# Patient Record
Sex: Female | Born: 1980
Health system: Southern US, Community
[De-identification: ages and names within clinical notes are randomized; demographics above are authoritative.]

## PROBLEM LIST (undated history)

## (undated) DIAGNOSIS — IMO0001 Reserved for inherently not codable concepts without codable children: Secondary | ICD-10-CM

## (undated) DIAGNOSIS — G43909 Migraine, unspecified, not intractable, without status migrainosus: Secondary | ICD-10-CM

## (undated) DIAGNOSIS — M797 Fibromyalgia: Secondary | ICD-10-CM

## (undated) HISTORY — PX: TONSILLECTOMY: SUR1361

## (undated) HISTORY — PX: TUBAL LIGATION: SHX77

## (undated) HISTORY — DX: Migraine, unspecified, not intractable, without status migrainosus: G43.909

## (undated) HISTORY — DX: Reserved for inherently not codable concepts without codable children: IMO0001

## (undated) HISTORY — PX: ABLATION: SHX5711

## (undated) HISTORY — PX: CHOLECYSTECTOMY: SHX55

---

## 1998-05-11 ENCOUNTER — Other Ambulatory Visit: Admission: RE | Admit: 1998-05-11 | Discharge: 1998-05-11 | Payer: Self-pay | Admitting: Obstetrics and Gynecology

## 1998-11-11 ENCOUNTER — Emergency Department (HOSPITAL_COMMUNITY): Admission: EM | Admit: 1998-11-11 | Discharge: 1998-11-11 | Payer: Self-pay | Admitting: Emergency Medicine

## 1999-06-26 ENCOUNTER — Inpatient Hospital Stay (HOSPITAL_COMMUNITY): Admission: AD | Admit: 1999-06-26 | Discharge: 1999-06-28 | Payer: Self-pay | Admitting: *Deleted

## 1999-06-26 ENCOUNTER — Encounter (INDEPENDENT_AMBULATORY_CARE_PROVIDER_SITE_OTHER): Payer: Self-pay

## 1999-08-06 ENCOUNTER — Other Ambulatory Visit: Admission: RE | Admit: 1999-08-06 | Discharge: 1999-08-06 | Payer: Self-pay | Admitting: *Deleted

## 2000-08-18 ENCOUNTER — Emergency Department (HOSPITAL_COMMUNITY): Admission: EM | Admit: 2000-08-18 | Discharge: 2000-08-18 | Payer: Self-pay | Admitting: Emergency Medicine

## 2000-09-08 ENCOUNTER — Other Ambulatory Visit: Admission: RE | Admit: 2000-09-08 | Discharge: 2000-09-08 | Payer: Self-pay | Admitting: *Deleted

## 2000-09-20 ENCOUNTER — Encounter: Payer: Self-pay | Admitting: Emergency Medicine

## 2000-09-20 ENCOUNTER — Emergency Department (HOSPITAL_COMMUNITY): Admission: EM | Admit: 2000-09-20 | Discharge: 2000-09-21 | Payer: Self-pay | Admitting: Emergency Medicine

## 2000-11-11 ENCOUNTER — Other Ambulatory Visit: Admission: RE | Admit: 2000-11-11 | Discharge: 2000-11-11 | Payer: Self-pay | Admitting: *Deleted

## 2000-11-11 ENCOUNTER — Encounter (INDEPENDENT_AMBULATORY_CARE_PROVIDER_SITE_OTHER): Payer: Self-pay

## 2000-11-13 ENCOUNTER — Encounter (INDEPENDENT_AMBULATORY_CARE_PROVIDER_SITE_OTHER): Payer: Self-pay | Admitting: Specialist

## 2000-11-13 ENCOUNTER — Encounter: Payer: Self-pay | Admitting: Surgery

## 2000-11-13 ENCOUNTER — Observation Stay (HOSPITAL_COMMUNITY): Admission: RE | Admit: 2000-11-13 | Discharge: 2000-11-14 | Payer: Self-pay | Admitting: Otolaryngology

## 2001-12-01 ENCOUNTER — Ambulatory Visit (HOSPITAL_COMMUNITY): Admission: RE | Admit: 2001-12-01 | Discharge: 2001-12-01 | Payer: Self-pay

## 2002-02-26 ENCOUNTER — Inpatient Hospital Stay (HOSPITAL_COMMUNITY): Admission: AD | Admit: 2002-02-26 | Discharge: 2002-02-28 | Payer: Self-pay

## 2002-09-09 ENCOUNTER — Emergency Department (HOSPITAL_COMMUNITY): Admission: EM | Admit: 2002-09-09 | Discharge: 2002-09-10 | Payer: Self-pay | Admitting: Emergency Medicine

## 2002-10-27 ENCOUNTER — Other Ambulatory Visit: Admission: RE | Admit: 2002-10-27 | Discharge: 2002-10-27 | Payer: Self-pay | Admitting: Obstetrics and Gynecology

## 2002-11-05 ENCOUNTER — Ambulatory Visit (HOSPITAL_COMMUNITY): Admission: RE | Admit: 2002-11-05 | Discharge: 2002-11-05 | Payer: Self-pay

## 2003-04-19 ENCOUNTER — Other Ambulatory Visit: Admission: RE | Admit: 2003-04-19 | Discharge: 2003-04-19 | Payer: Self-pay | Admitting: Obstetrics and Gynecology

## 2005-11-18 ENCOUNTER — Other Ambulatory Visit: Admission: RE | Admit: 2005-11-18 | Discharge: 2005-11-18 | Payer: Self-pay | Admitting: Obstetrics and Gynecology

## 2006-12-10 ENCOUNTER — Other Ambulatory Visit: Admission: RE | Admit: 2006-12-10 | Discharge: 2006-12-10 | Payer: Self-pay | Admitting: Nurse Practitioner

## 2007-01-02 ENCOUNTER — Encounter (INDEPENDENT_AMBULATORY_CARE_PROVIDER_SITE_OTHER): Payer: Self-pay | Admitting: Obstetrics and Gynecology

## 2007-01-02 ENCOUNTER — Ambulatory Visit (HOSPITAL_COMMUNITY): Admission: RE | Admit: 2007-01-02 | Discharge: 2007-01-02 | Payer: Self-pay | Admitting: Obstetrics and Gynecology

## 2007-05-14 ENCOUNTER — Ambulatory Visit (HOSPITAL_COMMUNITY): Admission: RE | Admit: 2007-05-14 | Discharge: 2007-05-14 | Payer: Self-pay | Admitting: Obstetrics and Gynecology

## 2007-12-23 ENCOUNTER — Encounter: Admission: RE | Admit: 2007-12-23 | Discharge: 2007-12-23 | Payer: Self-pay | Admitting: Obstetrics and Gynecology

## 2007-12-28 ENCOUNTER — Other Ambulatory Visit: Admission: RE | Admit: 2007-12-28 | Discharge: 2007-12-28 | Payer: Self-pay | Admitting: Obstetrics and Gynecology

## 2009-02-15 ENCOUNTER — Other Ambulatory Visit: Admission: RE | Admit: 2009-02-15 | Discharge: 2009-02-15 | Payer: Self-pay | Admitting: Obstetrics and Gynecology

## 2010-02-19 ENCOUNTER — Other Ambulatory Visit: Admission: RE | Admit: 2010-02-19 | Discharge: 2010-02-19 | Payer: Self-pay | Admitting: Obstetrics and Gynecology

## 2010-11-27 NOTE — Op Note (Signed)
NAMECATHYE, [name]              ACCOUNT NO.:  [id number]   MEDICAL RECORD NO.:  [id number]          PATIENT TYPE:  AMB   LOCATION:  SDC                           FACILITY:  [facility]   PHYSICIAN:  Charles A. Delcambre, MDDATE OF BIRTH:  05-09-1981   DATE OF PROCEDURE:  01/02/2007  DATE OF DISCHARGE:                               OPERATIVE REPORT   PREOPERATIVE DIAGNOSES:  1. Menorrhagia.  2. Endometrial polyps.   POSTOPERATIVE DIAGNOSES:  1. Menorrhagia.  2. Endometrial polyps.   PROCEDURE:  1. Hysteroscopy.  2. Dilatation and curettage.  3. Polypectomy.  4. Paracervical block.   SURGEON:  Charles A. Sydnee Cabal, M.D.   ASSISTANT:  None.   COMPLICATIONS:  None.   ANESTHESIA:  Monitored anesthesia care with IV sedation.   SPECIMENS:  1. Endometrial polyps.  2. Endometrial curettings to pathology.   ESTIMATED BLOOD LOSS:  10 mL.   COMPLICATIONS:  None.   FINDINGS:  Sound to 10 cm. Numerous polyps, mostly posterior, some  anterior, in the fundal region and encroaching upon the lower uterine  segment. No leiomyomas were seen. Instrument, sponge and needle count  correct x2.   SORBITOL LOST:  See OR record.   DESCRIPTION OF PROCEDURE:  The patient was taken to the operating room,  placed in supine position and IV sedation was accomplished. She was  placed in the dorsal lithotomy position in universal stirrups. Sterile  prep and drape was undertaken. Weighted speculum was placed in the  vagina. Tenaculum was placed on the cervix. Paracervical block 0.25%  plain Marcaine was placed at 4 and 8 o'clock, equally divided 20 mL.  There was no evidence of intravascular injection. Hanks dilators were  used enough to pass the small hysteroscope. Hysteroscopic findings were  noted above. Using polyp forceps, numerous polyps were removed without  difficulty. Generalized curettings were done with a small banjo curet.  Bleeding was acceptable. A moderate amount of tissue was  obtained.  Hemostasis was adequate. Procedure was terminated. Tenaculum was removed  and speculum was removed. The patient was taken to recovery room with  physician in attendance, having tolerated the procedure well.      Charles A. Sydnee Cabal, MD  Electronically Signed     CAD/MEDQ  D:  01/02/2007  T:  01/02/2007  Job:  [id number]

## 2010-11-27 NOTE — H&P (Signed)
[name], [name]              ACCOUNT NO.:  [id number]   MEDICAL RECORD NO.:  [id number]           PATIENT TYPE:   LOCATION:                                [facility]  [facility]   PHYSICIAN:  Charles A. Delcambre, MDDATE OF BIRTH:  01/04/1981   DATE OF ADMISSION:  DATE OF DISCHARGE:                              HISTORY & PHYSICAL   CHIEF COMPLAINT:  She is a 30 year old para 2002 who had been  amenorrheic with one period for one year, various light spotting at  times, but now since December, she has had almost constant bleeding for  4 months now.  There were several normal cycles after Provera.  She was  treated with Provera last fall and there was a cervical polyp, I  thought, but this would not be removed, just likely architecture of the  multipara cervix.  She understand sonohysterogram with what looks to be  a large anterior and posterior polyp, these measuring 1.3 x .4 anterior  wall and 0.7 x 0.5 posterior wall.  Endometrial wall total thickness  anterior and posterior 8 mm.  Because of her continued bleeding,  unexpected thickness and these polyps, evidently we will proceed with  hysteroscopy, D&C.  She gave [place] informed consent.  Accepts risks for  uterine perforation, bowel and bladder damage, fluid overload, failed  procedure to control her bleeding.  All questions were answered and she  accepts risks for blood products including hepatitis and HIV exposure  with bleeding encounter.   PAST MEDICAL HISTORY:  None.   PAST SURGICAL HISTORY:  Tubal ligation.   MEDICATIONS:  Iron supplements as she sometimes feels cold.   ALLERGIES:  Biaxin, reaction rash.   SOCIAL HISTORY:  Denies tobacco, ethanol or drug abuse or STD exposure  in the past.  She is not married, not sexually active currently.   FAMILY HISTORY:  Diabetes and hypertension.  She denies family history  of breast, uterus, ovary, cervix or colon cancer, lymphoma, coronary  artery disease or stroke.   REVIEW OF  SYSTEMS:  Denies fever or chills, skin rashes or lesions,  headaches, dizziness, seasonal allergies, chest pain, shortness of  breath, wheezing, diarrhea, constipation, bleeding, melena, or  hematochezia, urgency, frequency, dysuria, incontinence, hematuria,  emotional changes.   PHYSICAL EXAMINATION:  GENERAL:  Alert and oriented x3.  No distress.  VITAL SIGNS:  Blood pressure 110/60, heart rate 88, respiratory 22,  weight on 11/18/2005 was 222, on 12/10/2006 she was 220.  HEENT:  Grossly within normal limits.  NECK:  Supple without thyromegaly or adenopathy.  LUNGS:  Clear bilaterally.  BACK:  No CVAT.  Vertebral column nontender to palpation.  HEART:  Regular rate and rhythm without murmur, rub or gallop.  BREASTS:  No masses, tenderness or discharge.  SKIN:  No nipple changes bilaterally.  ABDOMEN:  Moderately obese and complicates examination, no  hepatosplenomegaly or other masses noted.  Exam is nontender.  PELVIS EXAM:  Normal external female genitalia, Bartholin, urethral,  Skene within normal limits.  Bowel without discharge or lesions.  Multipara cervix is noted.  Sound was to 8 cm with  sonohysterogram.  Findings are noted above.  By manual examination the uterus otherwise  without significant enlargement.  Ovaries appeared normal bilaterally as  well.  RECTAL EXAM:  Not done.  Anus and peroneal body appeared normal.   ASSESSMENT:  1. Irregular bleeding, 626.4.  2. Endometrial polyp 621.0.  3. Thickened endometrium.  4. Bleeding pattern.   PLAN:  Hysteroscopy, D&C.  Informed consent is given as noted above.  Preoperative CBC and uterine pregnancy test will be done.  All questions  were answered.  We will proceed as outlined once the surgery is  scheduled within the next severe weeks.      Charles A. Sydnee Cabal, MD  Electronically Signed     CAD/MEDQ  D:  12/16/2006  T:  12/17/2006  Job:  [id number]

## 2010-11-27 NOTE — Op Note (Signed)
[name], [name]              ACCOUNT NO.:  [id number]   MEDICAL RECORD NO.:  [id number]          PATIENT TYPE:  AMB   LOCATION:  SDC                           [facility]  [facility]   PHYSICIAN:  Charles A. Delcambre, MDDATE OF BIRTH:  July 18, 1980   DATE OF PROCEDURE:  05/14/2007  DATE OF DISCHARGE:                               OPERATIVE REPORT   PREOPERATIVE DIAGNOSES:  Irregular bleeding and menorrhagia.   POSTOPERATIVE DIAGNOSES:  Irregular bleeding and menorrhagia.   PROCEDURE:  1. ThermaChoice endometrial ablation.  2. Paracervical block.   SURGEON:  Charles A. Delcambre, MD.   ASSISTANT:  None.   COMPLICATIONS:  None.   ESTIMATED BLOOD LOSS:  None.   FINDINGS:  Sound to 9 cm.   ANESTHESIA:  General anesthetic.   Sponge and needle count correct x2.   DESCRIPTION OF PROCEDURE:  The patient was taken to the operating room,  placed in supine position.  General anesthetic was induced without  difficulty.  She was then placed in dorsal lithotomy position in  universal stirrups.  Sterile prep and drape was undertaken. A weighted  speculum was placed in the vagina. A tenaculum was placed on the  anterior lip of the cervix.  Paracervical block was placed 20 mL divided  equally at 4 and 8 o'clock.  There was no evidence of 0.25% plain  Marcaine.  There was no evidence of intravascular injection. The  ThermaChoice was prepped per protocol and brought to -190 extracting the  air from the balloon. The balloon was placed consistent with 9 cm backed  up about 1 cm off the wall for the electrode and pressure was equalized  170 mmHg pressure.  A heating cycle was then begun and heating to  87  degrees centigrade was accomplished without difficulty.  Pressure  remained stable. A therapeutic cycle was begun and proceeded without  difficulty for a total of 8  minutes. Cool-down was effected,  removal of the balloon was done  without difficulty.  Tenaculum was removed.  Hemostasis was  excellent.  The procedure was terminated. The speculum removed and the patient was  taken to recovery with physician in attendance.      Charles A. Sydnee Cabal, MD  Electronically Signed     CAD/MEDQ  D:  05/14/2007  T:  05/15/2007  Job:  [id number]

## 2010-11-27 NOTE — H&P (Signed)
NAMEHELLEN, [name]              ACCOUNT NO.:  [id number]   MEDICAL RECORD NO.:  [id number]          PATIENT TYPE:  AMB   LOCATION:  SDC                           [facility]   PHYSICIAN:  Charles A. Delcambre, MDDATE OF BIRTH:  1981/01/05   DATE OF ADMISSION:  05/14/2007  DATE OF DISCHARGE:                              HISTORY & PHYSICAL   PREADMISSION HISTORY AND PHYSICAL   This patient to be admitted on May 14, 2007, to undergo an  endometrial ablation and planned ThermaChoice secondary to menorrhagia.  She has had a tubal ligation as well as hysteroscopy D&C on January 02, 2007, with polypectomy of multiple benign polyps.  She continues with  heavy bleeding, however, passing clots the size of her hand and pelvic  pain not relieved by Tylenol.  Hemoglobin has remained up, however, with  hemoglobin 12.3 on April 22, 2007.   PAST MEDICAL HISTORY:  A 30 year old, gravida 2, para 2-0-0-2.  No past  medical history.   PAST SURGICAL HISTORY:  1. Tubal ligation.  2. D&C hysteroscopy with polypectomy.   MEDICATIONS:  Iron.   ALLERGIES:  BIAXIN, reaction a rash.   SOCIAL HISTORY:  Denies tobacco, ethanol, or drug use, or STD exposure  in the past.  She is not married and not sexually active currently.   FAMILY HISTORY:  Diabetes and hypertension.  Otherwise, denies a family  history of breast, uterus, ovary, cervix, or colon cancer, lymphoma,  coronary artery disease, or stroke.   REVIEW OF SYSTEMS:  Denies fever or chills, skin rashes, lesions,  headaches, dizziness, seasonal allergies, chest pain, shortness of  breath, wheezing, diarrhea, constipation, bleeding per rectum, melena,  or hematochezia, urgency, frequency, dysuria, or incontinence,  hematuria, and much more emotional changes.   PHYSICAL EXAMINATION:  VITAL SIGNS:  Blood pressure 120/80, respirations  20, heart rate 80, weight 216 pounds, height 5 foot 2 inches.  HEENT EXAM:  Grossly within normal  limits.  NECK:  Supple without thyromegaly or adenopathy.  LUNGS:  Clear bilaterally.  HEART:  Regular rate and rhythm without murmur, rub, or gallop.  BREASTS:  No masses, tenderness, or discharge.  SKIN:  No changes bilaterally.  ABDOMEN:  Moderate obesity present.  A limited examination, but no  masses palpable.  PELVIC EXAM:  Normal adult female genitalia.  Bartholin's, urethral, and  Skene's are within normal limits.  Vault without discharge currently  besides the blood on tip of her cervix.  On bimanual examination, the  uterus not significantly enlarged.  We will now proceed with endometrial  biopsy secondary to the recent hysteroscopy D&C being benign.  Adnexa  limited, but I could not palpate an ovary secondary to body habitus, no  masses were palpable and the exam was nontender.  EXTREMITIES:  No significant edema or pain.   ASSESSMENT:  Menorrhagia, failed hysteroscopy dilatation and curettage  with polypectomy, failed medical management with Progestins.   PLAN:  Endometrial ablation.  We plan a ThermaChoice.  She accepts risks  of bowel and bladder damage, uterine perforation, failure technically,  and we may convert to a rollerball-type  technique.  All questions were  answered, and we will proceed as outlined.  NPO after 0700, preoperative  CBC, and urine pregnancy test.      Bing Neighbors. Sydnee Cabal, MD  Electronically Signed     CAD/MEDQ  D:  04/29/2007  T:  04/29/2007  Job:  [id number]

## 2010-11-27 NOTE — H&P (Signed)
NAMECORRINNE, [name]              ACCOUNT NO.:  [id number]   MEDICAL RECORD NO.:  [id number]           PATIENT TYPE:   LOCATION:                                 FACILITY:   PHYSICIAN:  Charles A. Delcambre, MDDATE OF BIRTH:  1980/08/23   DATE OF ADMISSION:  DATE OF DISCHARGE:                              HISTORY & PHYSICAL   This patient is to be admitted to undergo ThermaChoice endometrial  ablation for continued menorrhagia even after failed progestins and  failed birth control pills and failed hysteroscopy D&C at which time  polyps were removed this past June.  She is continuing to have bleeding  despite progestin and now wishes to proceed with ablation.  Alternative  ablation types were discussed including NovaSure, Her Option, HDA,  ThermaChoice.  I would recommend ThermaChoice and she agrees that she  would prefer this method.  She gives informed consent, accepts risks of  technical failure, approximately 30-40% risk of amenorrhea, 90% success  of eumenorrhea, 10% failure rate, uterine perforation, damage to bowel  or bladder.   MEDICATIONS:  Iron and Provera 10 mg a day currently.   ALLERGIES:  BIAXIN causes a rash.   PAST MEDICAL HISTORY:  None.   PAST SURGICAL HISTORY:  1. Tubal ligation.  2. Hysteroscopy D&C with polypectomy.   SOCIAL HISTORY:  No tobacco, ethanol or drug use.  No STD exposure in  the past.  She is not married, not sexually active currently.   FAMILY HISTORY:  Diabetes and hypertension, otherwise denies family  history of breast, uterus, ovary, cervix or colon cancer, lymphoma,  coronary artery disease or stroke.   REVIEW OF SYSTEMS:  Denies fever, chills, skin rashes, lesions,  headache, dizziness, seasonal allergies, chest pain, shortness of  breath, wheezing, diarrhea, constipation, bleeding, melena,  hematochezia, urgency, frequency, dysuria, incontinence, hematuria or  emotional changes.   PHYSICAL EXAMINATION:  VITAL SIGNS:  Blood  pressure 120/80, respirations  20, pulse 80, afebrile.  GENERAL APPEARANCE:  Alert and oriented x3.  HEENT:  Grossly within normal limits.  NECK:  Supple without thyromegaly or adenopathy.  LUNGS:  Clear bilaterally.  BACK:  No CVAT.  Vertebral column not tender to palpation.  CARDIOVASCULAR:  Regular rate and rhythm without murmurs, rubs, or  gallops.  BREASTS:  No masses, tenderness or discharge, skin or nipple changes  bilaterally.  ABDOMEN:  Moderately obese complicates examination, no  hepatosplenomegaly or other masses noted.  Exam is nontender.  PELVIC:  Normal external female genitalia.  Bartholin's, urethra and  Skene's within normal limits.  Vault without discharge or lesions.  Multiparous.  Cervix is noted.  Sound has previously been done to 8 cm.  Uterus is otherwise without significant enlargement.  On bimanual  examination, ovaries palpated normally as well.  RECTAL:  Not done.  Anus and peritoneal body appear normal.   ASSESSMENT:  1. Irregular bleeding, 626.4.  2. Menorrhagia, 626.2.   PLAN:  ThermaChoice endometrial ablation.  Counseled as noted above.  Preoperative CBC, urine pregnancy test, n.p.o. past 0600 with surgery  scheduled for 2 p.m. May 14, 2007.  All questions  were answered and  will proceed as outlined.      Charles A. Sydnee Cabal, MD  Electronically Signed     CAD/MEDQ  D:  04/28/2007  T:  04/29/2007  Job:  [id number]

## 2010-11-30 NOTE — Op Note (Signed)
[facility]  Patient:    [name], [name]                       MRN: [id number] Proc. Date: 11/13/00 Adm. Date:  [id number] Attending:  Andre Lefort CC:         Dellis Anes. Idell Pickles, M.D.   Operative Report  DATE OF BIRTH:  17-Apr-1981.  PREOPERATIVE DIAGNOSIS:  Chronic cholecystitis and cholelithiasis.  History of pancreatitis.  POSTOPERATIVE DIAGNOSIS:  Cholelithiasis, chronic cholecystitis, history of pancreatitis.  OPERATION PERFORMED:  Laparoscopic cholecystectomy with intraoperative cholangiogram.  SURGEON:  Sandria Bales. Ezzard Standing, M.D.  ASSISTANT:  Abigail Miyamoto, M.D.  ANESTHESIA:  General endotracheal.  INDICATIONS FOR PROCEDURE:  The patient is a pleasant 30 year old black female who had symptomatic cholelithiasis.  She presented to the [facility] emergency department on September 21, 2000 where she was found to have cholelithiasis with a normal gallbladder wall by ultrasound.  She also had an amylase of 3039.  The patient is now asymptomatic and comes for elective laparoscopic cholecystectomy.  DESCRIPTION OF PROCEDURE:  The patient was placed in a supine position, given a general endotracheal anesthetic.  She had 1 gm of Ancef at the initiation of the procedure.  She had PAS stockings in place.  Her abdomen was prepped with Betadine solution and sterilely draped.  An infraumbilical incision was made with sharp dissection carried down to the abdominal cavity.  A 10 degree laparoscope was inserted through a 12 mm Hasson trocar.  The Hasson trocar was secured with a 0 Vicryl suture.  Laparoscopic exploration revealed both lobes of the liver were unremarkable.  The anterior wall of the stomach was unremarkable.  The remainder of the bowel was either obscured with omentum or unremarkable.  Three additional trocars were placed, a 10 mm subxiphoid  Ethicon trocar, a 5 mm right midsubcostal Ethicon trocar and a  right lateral subcostal Ethicon trocar.  The gallbladder was identified, grasped and rotated cephalad.  About one third of the way up the gallbladder there were adhesions to scar tissue consistent with chronic cholecystitis.  The cystic artery was identified, triply endoclipped and divided.  The cystic duct was identified.  The clip was placed on the gallbladder side of the cystic duct and intraoperative cholangiogram was then obtained.  Using a cutoff taut catheter through a 14 gauge Jelco it was inserted in the side of the cut cystic duct.  Intraoperative cholangiogram was obtained under fluoroscopy using half strength Hypaque solution, I injected about 8 cc. There was free flow of contrast down the cystic duct into the common bile duct into the duodenum.  There was free reflux up into the hepatic radicals.  There was no obstruction, no masses, felt to be a normal intraoperative cholangiogram.  The taut catheter was then removed.  The cystic duct was then triply endoclipped and divided.  The gallbladder was then sharply and bluntly dissected from the gallbladder bed.  Before dividing the gallbladder from the gallbladder bed, I revisualized the gallbladder and the gallbladder bed.  The triangle of the Calot and the gallbladder bed showed no bleeding and no bile leak.  The gallbladder was then delivered through the umbilicus through an Endocatch bag and sent to pathology.  Upon reinspecting the abdomen, there was a small tear on the right lateral dome of the liver which is probably about 1 to 2 cm in length.  I cauterized this and then laid a piece  of Surgicel over this.  The bleeding was stopped at the end of the procedure.  There was no other bleeding noted.  The trocars were then visualized as they were removed. There was no bleeding at the trocar site.  The umbilical trocar was closed with a 0 Vicryl suture.  The skin at each site was closed with a 5-0 Vicryl suture, painted with  tincture of benzoin and steri-stripped and sterilely dressed. The patient tolerated the procedure well, was transported to the recovery room in good condition.  The sponge and needle counts were correct at the end of the case. DD:  11/13/00 TD:  11/13/00 Job: [id number]

## 2010-11-30 NOTE — Op Note (Signed)
   NAME:  [name], [name]                        ACCOUNT NO.:  [id number]   MEDICAL RECORD NO.:  [id number]                   PATIENT TYPE:  AMB   LOCATION:  SDC                                  FACILITY:  [facility]   PHYSICIAN:  Ronda Fairly. Galen Daft, M.D.              DATE OF BIRTH:  August 25, 1980   DATE OF PROCEDURE:  11/05/2002  DATE OF DISCHARGE:                                 OPERATIVE REPORT   PREOPERATIVE DIAGNOSES:  1. Undesired fertility.  2. Multiparity.  3. Desires elective sterilization.   POSTOPERATIVE DIAGNOSES:  1. Undesired fertility.  2. Multiparity.  3. Desires elective sterilization.   PROCEDURE:  Laparoscopic tubal ligation.   SURGEON:  Ronda Fairly. Galen Daft, M.D.   ANESTHESIA:  General.   COMPLICATIONS:  None.   ESTIMATED BLOOD LOSS:  Less than 10 mL.   PROCEDURE:  The patient was identified as [name].  Time out was  performed.  Informed consent had been obtained prior to the procedure and it  was reviewed immediately prior to the procedure as well.  The umbilical  incision was utilized for abdominal access.  Direct entry with a 10 mL  trocar was utilized.  The abdomen was entered without difficulty.  No injury  occurred.  The abdomen was inflated with carbon dioxide gas and the single  puncture technique was utilized with the operative hysteroscope.  Prior to  entering the abdomen the patient had Betadine prep sterile technique and the  bladder emptied and drained.  A single tooth Hulka tenaculum was utilized  for uterine manipulation.  The uterus was unremarkable.  Tubes and ovaries  were unremarkable.  Left tube was grasped with the bipolar Kleppinger type  cautery and it was cauterized in three-quarters of its segment for complete  destruction down to 0 amplitude.  It was contiguous with the fimbriated end,  separate and distinct from other structures.  The right tube was done in the  same fashion with destruction of half of the tube on that side.  This  was  again 0 amplitude, separate, and distinct from other structures and  contiguous with the fimbriated end.  The abdomen was otherwise unremarkable.  There were no traumatic problems.  The abdomen was deflated and the fascia  was closed with 0 Vicryl followed by closure of the skin with 3-0 Monocryl.  All instrument, sponge, and needle counts were correct throughout the case.  The patient left the operating room in stable condition.                                               Ronda Fairly. Galen Daft, M.D.    NJT/MEDQ  D:  11/05/2002  T:  11/05/2002  Job:  [id number]

## 2011-02-21 ENCOUNTER — Other Ambulatory Visit: Payer: Self-pay | Admitting: Obstetrics and Gynecology

## 2011-02-21 ENCOUNTER — Other Ambulatory Visit (HOSPITAL_COMMUNITY)
Admission: RE | Admit: 2011-02-21 | Discharge: 2011-02-21 | Disposition: A | Discharge status: Home / Self Care | Payer: BC Managed Care – PPO | Source: Ambulatory Visit | Attending: Obstetrics and Gynecology | Admitting: Obstetrics and Gynecology

## 2011-02-21 DIAGNOSIS — Z01419 Encounter for gynecological examination (general) (routine) without abnormal findings: Secondary | ICD-10-CM | POA: Insufficient documentation

## 2011-04-24 LAB — CBC
MCHC: 33.7
MCV: 78.6
Platelets: 182

## 2011-04-24 LAB — PREGNANCY, URINE: Preg Test, Ur: NEGATIVE

## 2011-05-01 LAB — CBC
MCHC: 33.1
Platelets: 184
RDW: 13.7

## 2011-05-12 ENCOUNTER — Inpatient Hospital Stay (INDEPENDENT_AMBULATORY_CARE_PROVIDER_SITE_OTHER)
Admission: RE | Admit: 2011-05-12 | Discharge: 2011-05-12 | Disposition: A | Discharge status: Home / Self Care | Payer: Self-pay | Source: Ambulatory Visit | Attending: Family Medicine | Admitting: Family Medicine

## 2011-05-12 DIAGNOSIS — M779 Enthesopathy, unspecified: Secondary | ICD-10-CM

## 2012-03-03 ENCOUNTER — Other Ambulatory Visit: Payer: Self-pay | Admitting: Obstetrics and Gynecology

## 2012-03-03 ENCOUNTER — Other Ambulatory Visit (HOSPITAL_COMMUNITY)
Admission: RE | Admit: 2012-03-03 | Discharge: 2012-03-03 | Disposition: A | Discharge status: Home / Self Care | Payer: BC Managed Care – PPO | Source: Ambulatory Visit | Attending: Obstetrics and Gynecology | Admitting: Obstetrics and Gynecology

## 2012-03-03 DIAGNOSIS — Z01419 Encounter for gynecological examination (general) (routine) without abnormal findings: Secondary | ICD-10-CM | POA: Insufficient documentation

## 2013-03-04 ENCOUNTER — Other Ambulatory Visit: Payer: Self-pay | Admitting: Obstetrics and Gynecology

## 2013-03-04 ENCOUNTER — Other Ambulatory Visit (HOSPITAL_COMMUNITY)
Admission: RE | Admit: 2013-03-04 | Discharge: 2013-03-04 | Disposition: A | Discharge status: Home / Self Care | Payer: BC Managed Care – PPO | Source: Ambulatory Visit | Attending: Obstetrics and Gynecology | Admitting: Obstetrics and Gynecology

## 2013-03-04 DIAGNOSIS — Z01419 Encounter for gynecological examination (general) (routine) without abnormal findings: Secondary | ICD-10-CM | POA: Insufficient documentation

## 2013-03-04 DIAGNOSIS — Z1151 Encounter for screening for human papillomavirus (HPV): Secondary | ICD-10-CM | POA: Insufficient documentation

## 2013-03-04 DIAGNOSIS — Z113 Encounter for screening for infections with a predominantly sexual mode of transmission: Secondary | ICD-10-CM | POA: Insufficient documentation

## 2014-03-07 ENCOUNTER — Other Ambulatory Visit (HOSPITAL_COMMUNITY)
Admission: RE | Admit: 2014-03-07 | Discharge: 2014-03-07 | Disposition: A | Discharge status: Home / Self Care | Payer: BC Managed Care – PPO | Source: Ambulatory Visit | Attending: Obstetrics and Gynecology | Admitting: Obstetrics and Gynecology

## 2014-03-07 ENCOUNTER — Other Ambulatory Visit: Payer: Self-pay | Admitting: Obstetrics and Gynecology

## 2014-03-07 DIAGNOSIS — Z113 Encounter for screening for infections with a predominantly sexual mode of transmission: Secondary | ICD-10-CM | POA: Diagnosis present

## 2014-03-07 DIAGNOSIS — Z01419 Encounter for gynecological examination (general) (routine) without abnormal findings: Secondary | ICD-10-CM | POA: Diagnosis present

## 2014-03-09 LAB — CYTOLOGY - PAP

## 2014-08-14 ENCOUNTER — Encounter (HOSPITAL_COMMUNITY): Payer: Self-pay | Admitting: Emergency Medicine

## 2014-08-14 ENCOUNTER — Emergency Department (HOSPITAL_COMMUNITY)
Admission: EM | Admit: 2014-08-14 | Discharge: 2014-08-14 | Disposition: A | Discharge status: Home / Self Care | Payer: BC Managed Care – PPO | Attending: Emergency Medicine | Admitting: Emergency Medicine

## 2014-08-14 DIAGNOSIS — Z791 Long term (current) use of non-steroidal anti-inflammatories (NSAID): Secondary | ICD-10-CM | POA: Diagnosis not present

## 2014-08-14 DIAGNOSIS — Z79899 Other long term (current) drug therapy: Secondary | ICD-10-CM | POA: Diagnosis not present

## 2014-08-14 DIAGNOSIS — B349 Viral infection, unspecified: Secondary | ICD-10-CM | POA: Insufficient documentation

## 2014-08-14 DIAGNOSIS — Z3202 Encounter for pregnancy test, result negative: Secondary | ICD-10-CM | POA: Insufficient documentation

## 2014-08-14 DIAGNOSIS — R51 Headache: Secondary | ICD-10-CM | POA: Diagnosis present

## 2014-08-14 LAB — CBC WITH DIFFERENTIAL/PLATELET
Basophils Absolute: 0 10*3/uL (ref 0.0–0.1)
Basophils Relative: 0 % (ref 0–1)
EOS ABS: 0.3 10*3/uL (ref 0.0–0.7)
EOS PCT: 3 % (ref 0–5)
HCT: 41.5 % (ref 36.0–46.0)
Hemoglobin: 13.6 g/dL (ref 12.0–15.0)
LYMPHS ABS: 3 10*3/uL (ref 0.7–4.0)
LYMPHS PCT: 35 % (ref 12–46)
MCH: 27.6 pg (ref 26.0–34.0)
MCHC: 32.8 g/dL (ref 30.0–36.0)
MCV: 84.2 fL (ref 78.0–100.0)
MONO ABS: 0.4 10*3/uL (ref 0.1–1.0)
Monocytes Relative: 5 % (ref 3–12)
Neutro Abs: 4.9 10*3/uL (ref 1.7–7.7)
Neutrophils Relative %: 57 % (ref 43–77)
PLATELETS: 221 10*3/uL (ref 150–400)
RBC: 4.93 MIL/uL (ref 3.87–5.11)
RDW: 13.4 % (ref 11.5–15.5)
WBC: 8.6 10*3/uL (ref 4.0–10.5)

## 2014-08-14 LAB — I-STAT BETA HCG BLOOD, ED (MC, WL, AP ONLY): I-stat hCG, quantitative: <5 m[IU]/mL (ref <5)

## 2014-08-14 LAB — URINALYSIS, ROUTINE W REFLEX MICROSCOPIC
BILIRUBIN URINE: NEGATIVE
Glucose, UA: NEGATIVE mg/dL
Ketones, ur: NEGATIVE mg/dL
Leukocytes, UA: NEGATIVE
NITRITE: NEGATIVE
PROTEIN: NEGATIVE mg/dL
Specific Gravity, Urine: 1.031 — ABNORMAL HIGH (ref 1.005–1.030)
Urobilinogen, UA: 0.2 mg/dL (ref 0.0–1.0)
pH: 5.5 (ref 5.0–8.0)

## 2014-08-14 LAB — COMPREHENSIVE METABOLIC PANEL
ALT: 7 U/L (ref 0–35)
AST: 32 U/L (ref 0–37)
Albumin: 3.8 g/dL (ref 3.5–5.2)
Alkaline Phosphatase: 59 U/L (ref 39–117)
Anion gap: 9 (ref 5–15)
BILIRUBIN TOTAL: 1.5 mg/dL — AB (ref 0.3–1.2)
BUN: 15 mg/dL (ref 6–23)
CALCIUM: 8.9 mg/dL (ref 8.4–10.5)
CHLORIDE: 109 mmol/L (ref 96–112)
CO2: 19 mmol/L (ref 19–32)
Creatinine, Ser: 0.88 mg/dL (ref 0.50–1.10)
GFR, EST NON AFRICAN AMERICAN: 84 mL/min — AB (ref >90)
Glucose, Bld: 94 mg/dL (ref 70–99)
Potassium: 5.5 mmol/L — ABNORMAL HIGH (ref 3.5–5.1)
Sodium: 137 mmol/L (ref 135–145)
TOTAL PROTEIN: 7.2 g/dL (ref 6.0–8.3)

## 2014-08-14 LAB — URINE MICROSCOPIC-ADD ON

## 2014-08-14 LAB — I-STAT CG4 LACTIC ACID, ED: Lactic Acid, Venous: 2.44 mmol/L (ref 0.5–2.0)

## 2014-08-14 LAB — LIPASE, BLOOD: LIPASE: 31 U/L (ref 11–59)

## 2014-08-14 LAB — PREGNANCY, URINE: PREG TEST UR: NEGATIVE

## 2014-08-14 MED ORDER — METOCLOPRAMIDE HCL 5 MG/ML IJ SOLN
10.0000 mg | Freq: Once | INTRAMUSCULAR | Status: AC
  Administered 2014-08-14: 10 mg via INTRAVENOUS
  Filled 2014-08-14: qty 2

## 2014-08-14 MED ORDER — SODIUM CHLORIDE 0.9 % IV BOLUS (SEPSIS)
1000.0000 mL | Freq: Once | INTRAVENOUS | Status: AC
  Administered 2014-08-14: 1000 mL via INTRAVENOUS

## 2014-08-14 NOTE — ED Provider Notes (Signed)
CSN: [id number]     Arrival date & time 08/14/14  1146 History   First MD Initiated Contact with Patient 08/14/14 1359     Chief Complaint  Patient presents with  . Headache  . Neck Pain  . Abdominal Pain     (Consider location/radiation/quality/duration/timing/severity/associated sxs/prior Treatment) HPI Patient complains of bitemporal headache, posterior neck pain right flank pain and diffuse abdominal pain onset 2 AM today. Headache is described as sharp, not made better or worse by anything she treated herself with naproxen with partial relief. Associated symptoms include nausea which has resolved glucose stabilizer. she is presently hungry. She denies fever she also complains of right flank pain and lower abdominal pain since this morning. She denies vaginal discharge. She also admits to 2 episodes of diarrhea since this morning. No other associated symptoms. Nothing makes symptoms better or worse.  History reviewed. No pertinent past medical history. Past Surgical History  Procedure Laterality Date  . Cholecystectomy    . Tubal ligation    . Ablation    . Tonsillectomy     No family history on file. History  Substance Use Topics  . Smoking status: Never Smoker   . Smokeless tobacco: Not on file  . Alcohol Use: No   OB History    No data available     Review of Systems  Eyes: Positive for photophobia.  Gastrointestinal: Positive for abdominal pain and diarrhea.  Genitourinary: Positive for flank pain and vaginal bleeding.       Amenorrheic since uterine ablation however had 2 days of vaginal bleeding December 2015  Neurological: Positive for headaches.  All other systems reviewed and are negative.     Allergies  Pineapple and Biaxin  Home Medications   Prior to Admission medications   Medication Sig Start Date End Date Taking? Authorizing Provider  cholecalciferol (VITAMIN D) 1000 UNITS tablet Take 1,000 Units by mouth daily.   Yes Historical Provider, MD   naproxen sodium (ANAPROX) 220 MG tablet Take 440 mg by mouth 2 (two) times daily with a meal.   Yes Historical Provider, MD  tinidazole (TINDAMAX) 500 MG tablet Take 500 mg by mouth 2 (two) times daily. For 5 days   Yes Historical Provider, MD   BP 155/68 mmHg  Pulse 92  Temp(Src) 98.1 F (36.7 C) (Oral)  Resp 20  SpO2 100% Physical Exam  Constitutional: She is oriented to person, place, and time. She appears well-developed and well-nourished. No distress.  HENT:  Head: Normocephalic and atraumatic.  Right Ear: External ear normal.  Left Ear: External ear normal.  Mouth/Throat: Oropharynx is clear and moist.  Bilateral tympanic membranes normal  Eyes: Conjunctivae are normal. Pupils are equal, round, and reactive to light.  Neck: Neck supple. No tracheal deviation present. No thyromegaly present.  No signs of meningitis  Cardiovascular: Normal rate, regular rhythm and normal heart sounds.   No murmur heard. Pulmonary/Chest: Effort normal and breath sounds normal.  Abdominal: Soft. Bowel sounds are normal. She exhibits no distension. There is no tenderness.  Obese  Genitourinary:  No flank tenderness  Musculoskeletal: Normal range of motion. She exhibits no edema or tenderness.  Lymphadenopathy:    She has no cervical adenopathy.  Neurological: She is alert and oriented to person, place, and time. She has normal reflexes. No cranial nerve deficit. Coordination normal.  Moves all extremities well  Skin: Skin is warm and dry. No rash noted.  Psychiatric: She has a normal mood and affect. Thought content normal.  Nursing note and vitals reviewed.   ED Course  Procedures (including critical care time) Labs Review Labs Reviewed  COMPREHENSIVE METABOLIC PANEL - Abnormal; Notable for the following:    Potassium 5.5 (*)    Total Bilirubin 1.5 (*)    GFR calc non Af Amer 84 (*)    All other components within normal limits  URINALYSIS, ROUTINE W REFLEX MICROSCOPIC - Abnormal;  Notable for the following:    APPearance CLOUDY (*)    Specific Gravity, Urine 1.031 (*)    Hgb urine dipstick TRACE (*)    All other components within normal limits  CBC WITH DIFFERENTIAL/PLATELET  LIPASE, BLOOD  URINE MICROSCOPIC-ADD ON    Imaging Review No results found.   EKG Interpretation None     3:50 PM patient feels much improved after treatment with intravenous fluids and intravenous Reglan. She ate a meal in the emergency department. She is alert and oriented. Results for orders placed or performed during the hospital encounter of 08/14/14  CBC with Differential  Result Value Ref Range   WBC 8.6 4.0 - 10.5 K/uL   RBC 4.93 3.87 - 5.11 MIL/uL   Hemoglobin 13.6 12.0 - 15.0 g/dL   HCT 82.9 56.2 - 13.0 %   MCV 84.2 78.0 - 100.0 fL   MCH 27.6 26.0 - 34.0 pg   MCHC 32.8 30.0 - 36.0 g/dL   RDW 86.5 78.4 - 69.6 %   Platelets 221 150 - 400 K/uL   Neutrophils Relative % 57 43 - 77 %   Neutro Abs 4.9 1.7 - 7.7 K/uL   Lymphocytes Relative 35 12 - 46 %   Lymphs Abs 3.0 0.7 - 4.0 K/uL   Monocytes Relative 5 3 - 12 %   Monocytes Absolute 0.4 0.1 - 1.0 K/uL   Eosinophils Relative 3 0 - 5 %   Eosinophils Absolute 0.3 0.0 - 0.7 K/uL   Basophils Relative 0 0 - 1 %   Basophils Absolute 0.0 0.0 - 0.1 K/uL  Comprehensive metabolic panel  Result Value Ref Range   Sodium 137 135 - 145 mmol/L   Potassium 5.5 (H) 3.5 - 5.1 mmol/L   Chloride 109 96 - 112 mmol/L   CO2 19 19 - 32 mmol/L   Glucose, Bld 94 70 - 99 mg/dL   BUN 15 6 - 23 mg/dL   Creatinine, Ser 2.95 0.50 - 1.10 mg/dL   Calcium 8.9 8.4 - 28.4 mg/dL   Total Protein 7.2 6.0 - 8.3 g/dL   Albumin 3.8 3.5 - 5.2 g/dL   AST 32 0 - 37 U/L   ALT 7 0 - 35 U/L   Alkaline Phosphatase 59 39 - 117 U/L   Total Bilirubin 1.5 (H) 0.3 - 1.2 mg/dL   GFR calc non Af Amer 84 (L) >90 mL/min   GFR calc Af Amer >90 >90 mL/min   Anion gap 9 5 - 15  Lipase, blood  Result Value Ref Range   Lipase 31 11 - 59 U/L  Urinalysis, Routine w  reflex microscopic  Result Value Ref Range   Color, Urine YELLOW YELLOW   APPearance CLOUDY (A) CLEAR   Specific Gravity, Urine 1.031 (H) 1.005 - 1.030   pH 5.5 5.0 - 8.0   Glucose, UA NEGATIVE NEGATIVE mg/dL   Hgb urine dipstick TRACE (A) NEGATIVE   Bilirubin Urine NEGATIVE NEGATIVE   Ketones, ur NEGATIVE NEGATIVE mg/dL   Protein, ur NEGATIVE NEGATIVE mg/dL   Urobilinogen, UA 0.2 0.0 - 1.0  mg/dL   Nitrite NEGATIVE NEGATIVE   Leukocytes, UA NEGATIVE NEGATIVE  Urine microscopic-add on  Result Value Ref Range   Squamous Epithelial / LPF RARE RARE   WBC, UA 0-2 <3 WBC/hpf   Urine-Other MUCOUS PRESENT   Pregnancy, urine  Result Value Ref Range   Preg Test, Ur NEGATIVE NEGATIVE  I-Stat Beta hCG blood, ED ([facility], [facility], [facility] only)  Result Value Ref Range   I-stat hCG, quantitative <5.0 <5 mIU/mL   Comment 3          I-Stat CG4 Lactic Acid, ED  Result Value Ref Range   Lactic Acid, Venous 2.44 (HH) 0.5 - 2.0 mmol/L   Comment NOTIFIED PHYSICIAN    No results found.  MDM  Strongly suspect viral illness. With headache abdominal pain diarrhea no signs of meningitis or sepsis Plan Tylenol or Advil for pain Encourage oral hydration. Follow up with Dr.Barnes if not improved 3 -4 days Final diagnoses:  None   Diagnosis: viral illness     Doug SouSam Jennie Bolar, MD 08/14/14 1558

## 2014-08-14 NOTE — Discharge Instructions (Signed)
Make sure that you drink at least six 8 ounce glasses of water daily. Take Tylenol or Advil for discomfort. See Dr.Barnes in the office if you are not improving in 2 or 3 days or return if you feel worse for any reason.

## 2014-08-14 NOTE — ED Notes (Signed)
Pt c/o headache, neck pain and abd pain, n/d that started this morning.

## 2014-08-14 NOTE — ED Notes (Addendum)
Notified Dr. Ethelda ChickJacubowitz of Lactic 2.44

## 2014-08-14 NOTE — ED Notes (Signed)
Attempted IV start x2 w/o success. Patty, RN at bedside attempting IV access

## 2014-08-26 ENCOUNTER — Other Ambulatory Visit: Payer: Self-pay | Admitting: Family Medicine

## 2014-08-26 DIAGNOSIS — R51 Headache: Principal | ICD-10-CM

## 2014-08-26 DIAGNOSIS — R519 Headache, unspecified: Secondary | ICD-10-CM

## 2014-09-05 ENCOUNTER — Ambulatory Visit
Admission: RE | Admit: 2014-09-05 | Discharge: 2014-09-05 | Disposition: A | Discharge status: Home / Self Care | Payer: BC Managed Care – PPO | Source: Ambulatory Visit | Attending: Family Medicine | Admitting: Family Medicine

## 2014-09-05 DIAGNOSIS — R519 Headache, unspecified: Secondary | ICD-10-CM

## 2014-09-05 DIAGNOSIS — R51 Headache: Principal | ICD-10-CM

## 2014-09-21 ENCOUNTER — Encounter: Payer: Self-pay | Admitting: *Deleted

## 2014-09-21 ENCOUNTER — Ambulatory Visit (INDEPENDENT_AMBULATORY_CARE_PROVIDER_SITE_OTHER): Payer: BC Managed Care – PPO | Admitting: Diagnostic Neuroimaging

## 2014-09-21 ENCOUNTER — Encounter: Payer: Self-pay | Admitting: Diagnostic Neuroimaging

## 2014-09-21 VITALS — BP 128/78 | HR 99 | Ht 61.75 in | Wt 210.6 lb

## 2014-09-21 DIAGNOSIS — G43101 Migraine with aura, not intractable, with status migrainosus: Secondary | ICD-10-CM | POA: Diagnosis not present

## 2014-09-21 DIAGNOSIS — R42 Dizziness and giddiness: Secondary | ICD-10-CM

## 2014-09-21 DIAGNOSIS — G4452 New daily persistent headache (NDPH): Secondary | ICD-10-CM

## 2014-09-21 DIAGNOSIS — H538 Other visual disturbances: Secondary | ICD-10-CM

## 2014-09-21 MED ORDER — TOPIRAMATE 50 MG PO TABS: 50.0000 mg | ORAL_TABLET | Freq: Two times a day (BID) | ORAL | Status: DC

## 2014-09-21 NOTE — Progress Notes (Signed)
[facility]  PATIENT: [name] DOB: 19-Nov-1980  REFERRING CLINICIAN: E Barnes HISTORY FROM: patient; referring notes reviewed REASON FOR VISIT: new consult   HISTORICAL  CHIEF COMPLAINT:  Chief Complaint  Patient presents with  . New Evaluation    headaches    HISTORY OF PRESENT ILLNESS:   34 year old female with headaches since 08/14/2014. Patient woke up with severe headache, neck pain, nausea. She went to the emergency room was diagnosed with a possible viral infection. Headaches have continued since that time. She has had photophobia, phonophobia, dizziness, spots, stars, neck pain. No tinnitus or transient visual obscuration. She is having daily headaches which fluctuate in severity. Patient last went to work on 09/19/14 and has not been able to work since that time.  Patient has had migraine headaches since age 32 years old. Her last migraine headaches were in September 2015. Current headaches are different and more severe than prior migraines.   REVIEW OF SYSTEMS: Full 14 system review of systems performed and notable only for dizziness headache. Weight fluctuation from 200-235 pounds.  ALLERGIES: Allergies  Allergen Reactions  . Pineapple Anaphylaxis  . Biaxin [Clarithromycin] Rash    HOME MEDICATIONS: Outpatient Prescriptions Prior to Visit  Medication Sig Dispense Refill  . cholecalciferol (VITAMIN D) 1000 UNITS tablet Take 1,000 Units by mouth daily.    . naproxen sodium (ANAPROX) 220 MG tablet Take 440 mg by mouth 2 (two) times daily with a meal.    . tinidazole (TINDAMAX) 500 MG tablet Take 500 mg by mouth 2 (two) times daily. For 5 days     No facility-administered medications prior to visit.    PAST MEDICAL HISTORY: Past Medical History  Diagnosis Date  . Healthy adult   . Migraine     PAST SURGICAL HISTORY: Past Surgical History  Procedure Laterality Date  . Cholecystectomy    . Tubal ligation    . Ablation    .  Tonsillectomy      FAMILY HISTORY: Family History  Problem Relation Age of Onset  . Hypertension Mother   . Diabetes Mother   . Asthma Father   . Asthma Mother     SOCIAL HISTORY:  History   Social History  . Marital Status: Single    Spouse Name: N/A  . Number of Children: 2  . Years of Education: college    Occupational History  . Schools    Social History Main Topics  . Smoking status: Never Smoker   . Smokeless tobacco: Not on file  . Alcohol Use: No  . Drug Use: Not on file  . Sexual Activity: Not on file   Other Topics Concern  . Not on file   Social History Narrative   Lives with two children   Drinks caffeine every once in a while      PHYSICAL EXAM  Filed Vitals:   09/21/14 1024  BP: 128/78  Pulse: 99  Height: 5' 1.75" (1.568 m)  Weight: 210 lb 9.6 oz (95.528 kg)    Body mass index is 38.85 kg/(m^2).   Visual Acuity Screening   Right eye Left eye Both eyes  Without correction: 20/30 20/20   With correction:       No flowsheet data found.  GENERAL EXAM: Patient is in no distress; well developed, nourished and groomed; neck is supple  CARDIOVASCULAR: Regular rate and rhythm, no murmurs, no carotid bruits  NEUROLOGIC: MENTAL STATUS: awake, alert, oriented to person, place and time, recent and  remote memory intact, normal attention and concentration, language fluent, comprehension intact, naming intact, fund of knowledge appropriate CRANIAL NERVE: no papilledema on fundoscopic exam, pupils equal and reactive to light, visual fields full to confrontation, extraocular muscles intact, no nystagmus, facial sensation and strength symmetric, hearing intact, palate elevates symmetrically, uvula midline, shoulder shrug symmetric, tongue midline. MOTOR: normal bulk and tone, full strength in the BUE, BLE SENSORY: normal and symmetric to light touch, temperature, vibration COORDINATION: finger-nose-finger, fine finger movements normal REFLEXES: deep  tendon reflexes present and symmetric GAIT/STATION: narrow based gait; able to walk tandem; romberg is negative    DIAGNOSTIC DATA (LABS, IMAGING, TESTING) - I reviewed patient records, labs, notes, testing and imaging myself where available.  Lab Results  Component Value Date   WBC 8.6 08/14/2014   HGB 13.6 08/14/2014   HCT 41.5 08/14/2014   MCV 84.2 08/14/2014   PLT 221 08/14/2014      Component Value Date/Time   NA 137 08/14/2014 1211   K 5.5* 08/14/2014 1211   CL 109 08/14/2014 1211   CO2 19 08/14/2014 1211   GLUCOSE 94 08/14/2014 1211   BUN 15 08/14/2014 1211   CREATININE 0.88 08/14/2014 1211   CALCIUM 8.9 08/14/2014 1211   PROT 7.2 08/14/2014 1211   ALBUMIN 3.8 08/14/2014 1211   AST 32 08/14/2014 1211   ALT 7 08/14/2014 1211   ALKPHOS 59 08/14/2014 1211   BILITOT 1.5* 08/14/2014 1211   GFRNONAA 84* 08/14/2014 1211   GFRAA >90 08/14/2014 1211   No results found for: CHOL, HDL, LDLCALC, LDLDIRECT, TRIG, CHOLHDL No results found for: ZOXW9UHGBA1C No results found for: VITAMINB12 No results found for: TSH  I reviewed images myself and agree with interpretation. -[facility]P  09/05/14 CT head - No acute intracranial abnormality. Evidence for an empty sella which can be an incidental finding. However, if the patient's symptoms and headaches persist, consider further evaluation with MRI. Paranasal sinus disease centered in the left sphenoid sinus. Question mild exophthalmos.     ASSESSMENT AND PLAN  34 y.o. year old female here with history of migraine headaches since age 89 years old, now with new severe type of headache since 08/14/2014, associated with severe pain, neck pain, headache, weight gain and blurred vision. May represent transformed migraine versus pseudotumor cerebri or other secondary headache.  PLAN: - MRI brain - topiramate trial - headache diary - ophthalmology eval  Orders Placed This Encounter  Procedures  . MR Brain W Wo Contrast  . Ambulatory referral  to Ophthalmology   Meds ordered this encounter  Medications  . topiramate (TOPAMAX) 50 MG tablet    Sig: Take 1 tablet (50 mg total) by mouth 2 (two) times daily.    Dispense:  60 tablet    Refill:  12   Return in about 6 weeks (around 11/02/2014).    Suanne MarkerVIKRAM R. PENUMALLI, MD 09/21/2014, 11:22 AM Certified in Neurology, Neurophysiology and Neuroimaging  [facility] [address], [address] [place], [place] [address] [phone]

## 2014-09-21 NOTE — Patient Instructions (Signed)
Start topiramate 50mg  at bedtime x 1 week; then increase to twice a day.  Start headache diary.

## 2014-09-25 ENCOUNTER — Emergency Department (HOSPITAL_COMMUNITY): Payer: BC Managed Care – PPO

## 2014-09-25 ENCOUNTER — Emergency Department (HOSPITAL_COMMUNITY)
Admission: EM | Admit: 2014-09-25 | Discharge: 2014-09-25 | Disposition: A | Discharge status: Other Acute Inpatient Hospital | Payer: BC Managed Care – PPO | Source: Home / Self Care | Attending: Emergency Medicine | Admitting: Emergency Medicine

## 2014-09-25 ENCOUNTER — Encounter (HOSPITAL_COMMUNITY): Payer: Self-pay | Admitting: *Deleted

## 2014-09-25 ENCOUNTER — Encounter (HOSPITAL_COMMUNITY): Payer: Self-pay | Admitting: Emergency Medicine

## 2014-09-25 ENCOUNTER — Emergency Department (HOSPITAL_COMMUNITY)
Admission: EM | Admit: 2014-09-25 | Discharge: 2014-09-25 | Disposition: A | Discharge status: Home / Self Care | Payer: BC Managed Care – PPO | Attending: Emergency Medicine | Admitting: Emergency Medicine

## 2014-09-25 DIAGNOSIS — R109 Unspecified abdominal pain: Secondary | ICD-10-CM

## 2014-09-25 DIAGNOSIS — G43909 Migraine, unspecified, not intractable, without status migrainosus: Secondary | ICD-10-CM | POA: Diagnosis not present

## 2014-09-25 DIAGNOSIS — Z79899 Other long term (current) drug therapy: Secondary | ICD-10-CM | POA: Insufficient documentation

## 2014-09-25 DIAGNOSIS — Z9851 Tubal ligation status: Secondary | ICD-10-CM | POA: Insufficient documentation

## 2014-09-25 DIAGNOSIS — R1013 Epigastric pain: Secondary | ICD-10-CM

## 2014-09-25 LAB — CBC WITH DIFFERENTIAL/PLATELET
BASOS PCT: 0 % (ref 0–1)
Basophils Absolute: 0 10*3/uL (ref 0.0–0.1)
Eosinophils Absolute: 0.1 10*3/uL (ref 0.0–0.7)
Eosinophils Relative: 2 % (ref 0–5)
HCT: 40.3 % (ref 36.0–46.0)
Hemoglobin: 13.5 g/dL (ref 12.0–15.0)
LYMPHS PCT: 41 % (ref 12–46)
Lymphs Abs: 2.1 10*3/uL (ref 0.7–4.0)
MCH: 27.2 pg (ref 26.0–34.0)
MCHC: 33.5 g/dL (ref 30.0–36.0)
MCV: 81.1 fL (ref 78.0–100.0)
MONO ABS: 0.5 10*3/uL (ref 0.1–1.0)
Monocytes Relative: 9 % (ref 3–12)
Neutro Abs: 2.4 10*3/uL (ref 1.7–7.7)
Neutrophils Relative %: 48 % (ref 43–77)
Platelets: 180 10*3/uL (ref 150–400)
RBC: 4.97 MIL/uL (ref 3.87–5.11)
RDW: 13 % (ref 11.5–15.5)
WBC: 5.1 10*3/uL (ref 4.0–10.5)

## 2014-09-25 LAB — COMPREHENSIVE METABOLIC PANEL
ALBUMIN: 3.5 g/dL (ref 3.5–5.2)
ALK PHOS: 55 U/L (ref 39–117)
ALT: 16 U/L (ref 0–35)
AST: 18 U/L (ref 0–37)
Anion gap: 7 (ref 5–15)
BUN: 9 mg/dL (ref 6–23)
CO2: 25 mmol/L (ref 19–32)
Calcium: 9.2 mg/dL (ref 8.4–10.5)
Chloride: 106 mmol/L (ref 96–112)
Creatinine, Ser: 0.95 mg/dL (ref 0.50–1.10)
GFR calc Af Amer: 89 mL/min — ABNORMAL LOW (ref >90)
GFR calc non Af Amer: 77 mL/min — ABNORMAL LOW (ref >90)
GLUCOSE: 108 mg/dL — AB (ref 70–99)
Potassium: 3.6 mmol/L (ref 3.5–5.1)
SODIUM: 138 mmol/L (ref 135–145)
TOTAL PROTEIN: 7.3 g/dL (ref 6.0–8.3)
Total Bilirubin: 0.1 mg/dL — ABNORMAL LOW (ref 0.3–1.2)

## 2014-09-25 LAB — URINE MICROSCOPIC-ADD ON

## 2014-09-25 LAB — URINALYSIS, ROUTINE W REFLEX MICROSCOPIC
BILIRUBIN URINE: NEGATIVE
Glucose, UA: NEGATIVE mg/dL
Ketones, ur: NEGATIVE mg/dL
Leukocytes, UA: NEGATIVE
Nitrite: NEGATIVE
Protein, ur: NEGATIVE mg/dL
Specific Gravity, Urine: 1.015 (ref 1.005–1.030)
Urobilinogen, UA: 0.2 mg/dL (ref 0.0–1.0)
pH: 6 (ref 5.0–8.0)

## 2014-09-25 LAB — POCT I-STAT, CHEM 8
BUN: 9 mg/dL (ref 6–23)
Calcium, Ion: 1.2 mmol/L (ref 1.12–1.23)
Chloride: 106 mmol/L (ref 96–112)
Creatinine, Ser: 0.8 mg/dL (ref 0.50–1.10)
GLUCOSE: 106 mg/dL — AB (ref 70–99)
HCT: 44 % (ref 36.0–46.0)
Hemoglobin: 15 g/dL (ref 12.0–15.0)
POTASSIUM: 3.5 mmol/L (ref 3.5–5.1)
Sodium: 138 mmol/L (ref 135–145)
TCO2: 17 mmol/L (ref 0–100)

## 2014-09-25 LAB — POCT PREGNANCY, URINE: Preg Test, Ur: NEGATIVE

## 2014-09-25 MED ORDER — HYDROCODONE-ACETAMINOPHEN 5-325 MG PO TABS: 2.0000 | ORAL_TABLET | ORAL | Status: DC | PRN

## 2014-09-25 MED ORDER — OXYCODONE-ACETAMINOPHEN 5-325 MG PO TABS
2.0000 | ORAL_TABLET | Freq: Once | ORAL | Status: AC
  Administered 2014-09-25: 2 via ORAL
  Filled 2014-09-25: qty 2

## 2014-09-25 MED ORDER — MORPHINE SULFATE 2 MG/ML IJ SOLN
2.0000 mg | Freq: Once | INTRAMUSCULAR | Status: AC
  Administered 2014-09-25: 2 mg via INTRAVENOUS

## 2014-09-25 MED ORDER — MORPHINE SULFATE 2 MG/ML IJ SOLN
INTRAMUSCULAR | Status: AC
  Filled 2014-09-25: qty 1

## 2014-09-25 NOTE — ED Notes (Signed)
Pt  Reports   Severe     r  Sided    Pain       The  Pain  Is     persistant      denys  Any  Nausea  Or  Vomiting           She         Is       Sitting    Up         Right         On  Exam  Table       Appearing       In     Some  distress

## 2014-09-25 NOTE — ED Notes (Signed)
Iv  Ns   tko  20  Angio  r  Hand   1  Att     Site  Is  patent

## 2014-09-25 NOTE — Discharge Instructions (Signed)
Abdominal Pain, Women °Abdominal (stomach, pelvic, or belly) pain can be caused by many things. It is important to tell your doctor: °· The location of the pain. °· Does it come and go or is it present all the time? °· Are there things that start the pain (eating certain foods, exercise)? °· Are there other symptoms associated with the pain (fever, nausea, vomiting, diarrhea)? °All of this is helpful to know when trying to find the cause of the pain. °CAUSES  °· Stomach: virus or bacteria infection, or ulcer. °· Intestine: appendicitis (inflamed appendix), regional ileitis (Crohn's disease), ulcerative colitis (inflamed colon), irritable bowel syndrome, diverticulitis (inflamed diverticulum of the colon), or cancer of the stomach or intestine. °· Gallbladder disease or stones in the gallbladder. °· Kidney disease, kidney stones, or infection. °· Pancreas infection or cancer. °· Fibromyalgia (pain disorder). °· Diseases of the female organs: °¨ Uterus: fibroid (non-cancerous) tumors or infection. °¨ Fallopian tubes: infection or tubal pregnancy. °¨ Ovary: cysts or tumors. °¨ Pelvic adhesions (scar tissue). °¨ Endometriosis (uterus lining tissue growing in the pelvis and on the pelvic organs). °¨ Pelvic congestion syndrome (female organs filling up with blood just before the menstrual period). °¨ Pain with the menstrual period. °¨ Pain with ovulation (producing an egg). °¨ Pain with an IUD (intrauterine device, birth control) in the uterus. °¨ Cancer of the female organs. °· Functional pain (pain not caused by a disease, may improve without treatment). °· Psychological pain. °· Depression. °DIAGNOSIS  °Your doctor will decide the seriousness of your pain by doing an examination. °· Blood tests. °· X-rays. °· Ultrasound. °· CT scan (computed tomography, special type of X-ray). °· MRI (magnetic resonance imaging). °· Cultures, for infection. °· Barium enema (dye inserted in the large intestine, to better view it with  X-rays). °· Colonoscopy (looking in intestine with a lighted tube). °· Laparoscopy (minor surgery, looking in abdomen with a lighted tube). °· Major abdominal exploratory surgery (looking in abdomen with a large incision). °TREATMENT  °The treatment will depend on the cause of the pain.  °· Many cases can be observed and treated at home. °· Over-the-counter medicines recommended by your caregiver. °· Prescription medicine. °· Antibiotics, for infection. °· Birth control pills, for painful periods or for ovulation pain. °· Hormone treatment, for endometriosis. °· Nerve blocking injections. °· Physical therapy. °· Antidepressants. °· Counseling with a psychologist or psychiatrist. °· Minor or major surgery. °HOME CARE INSTRUCTIONS  °· Do not take laxatives, unless directed by your caregiver. °· Take over-the-counter pain medicine only if ordered by your caregiver. Do not take aspirin because it can cause an upset stomach or bleeding. °· Try a clear liquid diet (broth or water) as ordered by your caregiver. Slowly move to a bland diet, as tolerated, if the pain is related to the stomach or intestine. °· Have a thermometer and take your temperature several times a day, and record it. °· Bed rest and sleep, if it helps the pain. °· Avoid sexual intercourse, if it causes pain. °· Avoid stressful situations. °· Keep your follow-up appointments and tests, as your caregiver orders. °· If the pain does not go away with medicine or surgery, you may try: °¨ Acupuncture. °¨ Relaxation exercises (yoga, meditation). °¨ Group therapy. °¨ Counseling. °SEEK MEDICAL CARE IF:  °· You notice certain foods cause stomach pain. °· Your home care treatment is not helping your pain. °· You need stronger pain medicine. °· You want your IUD removed. °· You feel faint or   lightheaded. °· You develop nausea and vomiting. °· You develop a rash. °· You are having side effects or an allergy to your medicine. °SEEK IMMEDIATE MEDICAL CARE IF:  °· Your  pain does not go away or gets worse. °· You have a fever. °· Your pain is felt only in portions of the abdomen. The right side could possibly be appendicitis. The left lower portion of the abdomen could be colitis or diverticulitis. °· You are passing blood in your stools (bright red or black tarry stools, with or without vomiting). °· You have blood in your urine. °· You develop chills, with or without a fever. °· You pass out. °MAKE SURE YOU:  °· Understand these instructions. °· Will watch your condition. °· Will get help right away if you are not doing well or get worse. °Document Released: 04/28/2007 Document Revised: 11/15/2013 Document Reviewed: 05/18/2009 °[employer]® Patient Information ©2015 [employer]. This information is not intended to replace advice given to you by your health care provider. Make sure you discuss any questions you have with your health care provider. ° °

## 2014-09-25 NOTE — ED Notes (Signed)
Patient transported to CT 

## 2014-09-25 NOTE — ED Notes (Signed)
Pain is   Coming  Back           Pt  Will  Be  Transferred   To  Ed  Iv is  patent

## 2014-09-25 NOTE — ED Provider Notes (Signed)
CSN: [id number]     Arrival date & time 09/25/14  1443 History   First MD Initiated Contact with Patient 09/25/14 1453     Chief Complaint  Patient presents with  . Abdominal Pain     (Consider location/radiation/quality/duration/timing/severity/associated sxs/prior Treatment) HPI Comments: The patient Ms. Rohr presents from urgent care after having abdominal pain which started 2 days ago, is located mostly in the right flank with some radiation to the right abdomen, also is now having pain in the left flank, no dysuria, no diarrhea, no rectal bleeding, no vomiting. Nothing seems to make this better, worse with movement or palpation over those areas, she does have a history of cholecystitis as well as bilateral tubal ligation.  Patient is a 34 y.o. female presenting with abdominal pain. The history is provided by the patient.  Abdominal Pain   Past Medical History  Diagnosis Date  . Healthy adult   . Migraine    Past Surgical History  Procedure Laterality Date  . Cholecystectomy    . Tubal ligation    . Ablation    . Tonsillectomy     Family History  Problem Relation Age of Onset  . Hypertension Mother   . Diabetes Mother   . Asthma Father   . Asthma Mother    History  Substance Use Topics  . Smoking status: Never Smoker   . Smokeless tobacco: Not on file  . Alcohol Use: No   OB History    No data available     Review of Systems  Gastrointestinal: Positive for abdominal pain.  All other systems reviewed and are negative.     Allergies  Pineapple and Biaxin  Home Medications   Prior to Admission medications   Medication Sig Start Date End Date Taking? Authorizing Provider  aspirin-acetaminophen-caffeine (EXCEDRIN MIGRAINE) [phone] MG per tablet Take by mouth every 6 (six) hours as needed for headache.   Yes Historical Provider, MD  Boric Acid POWD 1 application by Does not apply route once a week.   Yes Historical Provider, MD  Cholecalciferol (VITAMIN  D3) 5000 UNITS TABS Take 5,000 Units by mouth daily.   Yes Historical Provider, MD  Ibuprofen 200 MG CAPS Take 200 mg by mouth every 6 (six) hours as needed (pain).    Yes Historical Provider, MD  topiramate (TOPAMAX) 50 MG tablet Take 1 tablet (50 mg total) by mouth 2 (two) times daily. 09/21/14  Yes Suanne Marker, MD  HYDROcodone-acetaminophen (NORCO/VICODIN) 5-325 MG per tablet Take 2 tablets by mouth every 4 (four) hours as needed. 09/25/14   Eber Hong, MD   BP 105/62 mmHg  Pulse 74  Temp(Src) 98.2 F (36.8 C) (Oral)  Resp 16  SpO2 100% Physical Exam  Constitutional: She appears well-developed and well-nourished. No distress.  HENT:  Head: Normocephalic and atraumatic.  Mouth/Throat: Oropharynx is clear and moist. No oropharyngeal exudate.  Eyes: Conjunctivae and EOM are normal. Pupils are equal, round, and reactive to light. Right eye exhibits no discharge. Left eye exhibits no discharge. No scleral icterus.  Neck: Normal range of motion. Neck supple. No JVD present. No thyromegaly present.  Cardiovascular: Normal rate, regular rhythm, normal heart sounds and intact distal pulses.  Exam reveals no gallop and no friction rub.   No murmur heard. Pulmonary/Chest: Effort normal and breath sounds normal. No respiratory distress. She has no wheezes. She has no rales.  Abdominal: Soft. Bowel sounds are normal. She exhibits no distension and no mass. There is tenderness (minimal  epigastric tenderness).  Musculoskeletal: Normal range of motion. She exhibits tenderness. She exhibits no edema.  Lymphadenopathy:    She has no cervical adenopathy.  Neurological: She is alert. Coordination (tenderness over the right flank to even light touch) normal.  Skin: Skin is warm and dry. No rash noted. No erythema.  No rashes overlying the areas of tenderness  Psychiatric: She has a normal mood and affect. Her behavior is normal.  Nursing note and vitals reviewed.   ED Course  Procedures  (including critical care time) Labs Review Labs Reviewed  URINALYSIS, ROUTINE W REFLEX MICROSCOPIC - Abnormal; Notable for the following:    Hgb urine dipstick SMALL (*)    All other components within normal limits  URINE MICROSCOPIC-ADD ON    Imaging Review Ct Abdomen Pelvis Wo Contrast  09/25/2014   CLINICAL DATA:  Right abdominal pain since Friday with back pain and hematuria.  EXAM: CT ABDOMEN AND PELVIS WITHOUT CONTRAST  TECHNIQUE: Multidetector CT imaging of the abdomen and pelvis was performed following the standard protocol without IV contrast.  COMPARISON:  None.  FINDINGS: Lung bases are normal.  Abdominal images demonstrate evidence of a previous cholecystectomy. There is a sub cm hypodensity over the right lobe of the liver likely a cyst or hemangioma. The spleen, pancreas and adrenal glands are normal. Kidneys are normal in size, shape and position without hydronephrosis or nephrolithiasis. Ureters are normal. The appendix is normal. Cecum is located in the midline upper pelvis. Vascular structures are normal. Large and small bowel are within normal. No free fluid or focal inflammatory change. There is a small umbilical hernia containing only mesenteric fat.  Pelvic images demonstrate the uterus, ovaries, bladder and rectum to be within normal. Minimal degenerative change of the hips.  IMPRESSION: No acute findings in the abdomen/pelvis.  Small umbilical hernia containing only mesenteric fat.  Subcentimeter hypodensity over the right lobe of the liver likely a cyst or hemangioma.   Electronically Signed   By: Elberta Fortisaniel  Boyle M.D.   On: 09/25/2014 16:01      MDM   Final diagnoses:  Flank pain  Epigastric pain    The patient's abdominal exam is benign, nonsurgical, labs are unremarkable and reassuring, urinalysis shows no blood, CT scan shows no signs of kidney stone or other intra-abdominal acute process. She has improved with medications, she will be discharged home with supportive  care and close follow-up with family doctor. She is informed of her results and stable for discharge  Meds given in ED:  Medications  oxyCODONE-acetaminophen (PERCOCET/ROXICET) 5-325 MG per tablet 2 tablet (2 tablets Oral Given 09/25/14 1625)    New Prescriptions   HYDROCODONE-ACETAMINOPHEN (NORCO/VICODIN) 5-325 MG PER TABLET    Take 2 tablets by mouth every 4 (four) hours as needed.        Eber HongBrian Kaliyan Osbourn, MD 09/25/14 [phone]

## 2014-09-25 NOTE — ED Provider Notes (Signed)
CSN: [id number]     Arrival date & time 09/25/14  1110 History   First MD Initiated Contact with Patient 09/25/14 1136     Chief Complaint  Patient presents with  . Abdominal Pain   (Consider location/radiation/quality/duration/timing/severity/associated sxs/prior Treatment) HPI  She is a 34 year old woman here for evaluation of abdominal pain. She states this pain started Friday night in the epigastric region. Today, she states it is more in the right side. It is worse with movement, deep breaths, palpation. She denies any nausea or vomiting. She has been tolerating food and liquids today, despite decreased appetite. No fevers or chills. The pain is constant and sharp. She states it is similar to the pain she had with her gallbladder before her gallbladder was removed.  No injury or trauma. She denies any dysuria or hematuria.  Past Medical History  Diagnosis Date  . Healthy adult   . Migraine    Past Surgical History  Procedure Laterality Date  . Cholecystectomy    . Tubal ligation    . Ablation    . Tonsillectomy     Family History  Problem Relation Age of Onset  . Hypertension Mother   . Diabetes Mother   . Asthma Father   . Asthma Mother    History  Substance Use Topics  . Smoking status: Never Smoker   . Smokeless tobacco: Not on file  . Alcohol Use: No   OB History    No data available     Review of Systems  Constitutional: Positive for appetite change. Negative for fever and chills.  HENT: Negative.   Respiratory: Negative.   Cardiovascular: Negative.   Gastrointestinal: Positive for abdominal pain. Negative for nausea, vomiting and diarrhea.  Genitourinary: Negative for dysuria and hematuria.  Musculoskeletal: Negative for myalgias.    Allergies  Pineapple and Biaxin  Home Medications   Prior to Admission medications   Medication Sig Start Date End Date Taking? Authorizing Provider  aspirin-acetaminophen-caffeine (EXCEDRIN MIGRAINE) [phone] MG per  tablet Take by mouth every 6 (six) hours as needed for headache.    Historical Provider, MD  Boric Acid POWD 1 application by Does not apply route once a week.    Historical Provider, MD  cholecalciferol (VITAMIN D) 1000 UNITS tablet Take 1,000 Units by mouth daily.    Historical Provider, MD  Ibuprofen 200 MG CAPS Take 1 capsule by mouth every 6 (six) hours as needed.    Historical Provider, MD  topiramate (TOPAMAX) 50 MG tablet Take 1 tablet (50 mg total) by mouth 2 (two) times daily. 09/21/14   Vikram R Penumalli, MD   BP 132/81 mmHg  Pulse 94  Temp(Src) 98.3 F (36.8 C) (Oral)  Resp 16  SpO2 100% Physical Exam  Constitutional: She is oriented to person, place, and time. She appears well-developed and well-nourished. She appears distressed (looks uncomfortable).  Neck: Neck supple.  Cardiovascular: Normal rate.   Pulmonary/Chest: Effort normal.  Abdominal: Soft. Bowel sounds are normal. She exhibits no distension. There is tenderness (In epigastric and right upper quadrant and right flank). There is no rebound and no guarding.  Neurological: She is alert and oriented to person, place, and time.    ED Course  Procedures (including critical care time) Labs Review Labs Reviewed  COMPREHENSIVE METABOLIC PANEL - Abnormal; Notable for the following:    Glucose, Bld 108 (*)    Total Bilirubin 0.1 (*)    GFR calc non Af Amer 77 (*)    GFR calc  Af Amer 89 (*)    All other components within normal limits  POCT I-STAT, CHEM 8 - Abnormal; Notable for the following:    Glucose, Bld 106 (*)    All other components within normal limits  CBC WITH DIFFERENTIAL/PLATELET  POCT PREGNANCY, URINE    Imaging Review No results found.   MDM  No diagnosis found. We'll start IV and give morphine 2 mg IV. Pain is essentially resolved after 2 mg of morphine. She is however quite sleepy after this medication.  UA is notable for moderate blood and trace protein. Labwork is most consistent with a  kidney stone. At this time that morphine is starting to wear off and she is reporting significant pain still. Will transfer to [facility] ER for additional evaluation and pain management.   Charm Rings, MD 09/25/14 1343

## 2014-09-25 NOTE — ED Notes (Signed)
[facility] and back pain since Friday, Pt states has been seen by PCP for ongoing abd pain since January and possible UTI. [facility] noted pt urine positive for blood and also protein, would like to be evaluated for possible for kidney stone. nad noted. [facility]

## 2014-09-26 LAB — POCT URINALYSIS DIP (DEVICE)
Bilirubin Urine: NEGATIVE
Glucose, UA: NEGATIVE mg/dL
Ketones, ur: NEGATIVE mg/dL
Leukocytes, UA: NEGATIVE
Nitrite: NEGATIVE
Protein, ur: NEGATIVE mg/dL
UROBILINOGEN UA: 0.2 mg/dL (ref 0.0–1.0)
pH: 5.5 (ref 5.0–8.0)

## 2014-09-29 ENCOUNTER — Ambulatory Visit (INDEPENDENT_AMBULATORY_CARE_PROVIDER_SITE_OTHER): Payer: BC Managed Care – PPO

## 2014-09-29 DIAGNOSIS — G43101 Migraine with aura, not intractable, with status migrainosus: Secondary | ICD-10-CM | POA: Diagnosis not present

## 2014-09-29 DIAGNOSIS — G4452 New daily persistent headache (NDPH): Secondary | ICD-10-CM | POA: Diagnosis not present

## 2014-09-29 DIAGNOSIS — R42 Dizziness and giddiness: Secondary | ICD-10-CM

## 2014-09-29 MED ORDER — GADOPENTETATE DIMEGLUMINE 469.01 MG/ML IV SOLN: 20.0000 mL | Freq: Once | INTRAVENOUS | Status: AC | PRN

## 2014-09-30 ENCOUNTER — Other Ambulatory Visit (HOSPITAL_COMMUNITY): Payer: Self-pay | Admitting: Gastroenterology

## 2014-09-30 DIAGNOSIS — G8929 Other chronic pain: Secondary | ICD-10-CM

## 2014-09-30 DIAGNOSIS — R1011 Right upper quadrant pain: Principal | ICD-10-CM

## 2014-10-12 ENCOUNTER — Ambulatory Visit (HOSPITAL_COMMUNITY)
Admission: RE | Admit: 2014-10-12 | Discharge: 2014-10-12 | Disposition: A | Discharge status: Home / Self Care | Payer: BC Managed Care – PPO | Source: Ambulatory Visit | Attending: Gastroenterology | Admitting: Gastroenterology

## 2014-10-12 ENCOUNTER — Telehealth: Payer: Self-pay | Admitting: Diagnostic Neuroimaging

## 2014-10-12 DIAGNOSIS — G8929 Other chronic pain: Secondary | ICD-10-CM

## 2014-10-12 DIAGNOSIS — R1011 Right upper quadrant pain: Secondary | ICD-10-CM | POA: Diagnosis present

## 2014-10-12 MED ORDER — TECHNETIUM TC 99M SULFUR COLLOID
2.2000 | Freq: Once | INTRAVENOUS | Status: AC | PRN
  Administered 2014-10-12: 2.2 via INTRAVENOUS

## 2014-10-12 NOTE — Telephone Encounter (Signed)
Patient calling for MRI results.  Please call and advise. °

## 2014-10-17 NOTE — Telephone Encounter (Signed)
Pt is calling back requesting MRI results.  I did inform the pt that Dr. Marjory LiesPenumalli is out of the office today and will return on Tuesday 4/5.

## 2014-10-17 NOTE — Telephone Encounter (Signed)
Called and left a message for the pt per Dr. Marjory LiesPenumalli stating that the MRI was normal. He also wants the pt to follow-up with an opthomologist and to keep a headache diary. I explained a little about the diary on the phone and then reminded her of her FU with us on April 19th. I told her to call back to the office with any further questions.

## 2014-11-01 ENCOUNTER — Ambulatory Visit: Payer: BC Managed Care – PPO | Admitting: Diagnostic Neuroimaging

## 2014-11-10 ENCOUNTER — Telehealth: Payer: Self-pay | Admitting: *Deleted

## 2014-11-10 NOTE — Telephone Encounter (Signed)
Called and left a message for the pt asking her to call back and schedule a follow up appt to talk about her MRI. Dr. Dolores HooseAlmony the opthomologist also sent a letter stating he recommended a lumbar puncture. If she calls back, please get her in ASAP. If she has any questions, please leave me a message

## 2015-03-08 ENCOUNTER — Other Ambulatory Visit: Payer: Self-pay | Admitting: Obstetrics and Gynecology

## 2015-03-08 ENCOUNTER — Other Ambulatory Visit (HOSPITAL_COMMUNITY)
Admission: RE | Admit: 2015-03-08 | Discharge: 2015-03-08 | Disposition: A | Discharge status: Home / Self Care | Payer: BC Managed Care – PPO | Source: Ambulatory Visit | Attending: Obstetrics and Gynecology | Admitting: Obstetrics and Gynecology

## 2015-03-08 DIAGNOSIS — Z01419 Encounter for gynecological examination (general) (routine) without abnormal findings: Secondary | ICD-10-CM | POA: Insufficient documentation

## 2015-03-09 LAB — CYTOLOGY - PAP

## 2016-03-20 ENCOUNTER — Encounter: Payer: Self-pay | Admitting: Neurology

## 2016-03-21 ENCOUNTER — Other Ambulatory Visit: Payer: Self-pay | Admitting: Obstetrics and Gynecology

## 2016-03-21 ENCOUNTER — Other Ambulatory Visit (HOSPITAL_COMMUNITY)
Admission: RE | Admit: 2016-03-21 | Discharge: 2016-03-21 | Disposition: A | Discharge status: Home / Self Care | Payer: BC Managed Care – PPO | Source: Ambulatory Visit | Attending: Obstetrics and Gynecology | Admitting: Obstetrics and Gynecology

## 2016-03-21 DIAGNOSIS — Z01419 Encounter for gynecological examination (general) (routine) without abnormal findings: Secondary | ICD-10-CM | POA: Diagnosis present

## 2016-03-21 DIAGNOSIS — Z1151 Encounter for screening for human papillomavirus (HPV): Secondary | ICD-10-CM | POA: Diagnosis not present

## 2016-03-26 LAB — CYTOLOGY - PAP

## 2018-01-19 DIAGNOSIS — R7303 Prediabetes: Secondary | ICD-10-CM | POA: Diagnosis not present

## 2018-02-28 DIAGNOSIS — B373 Candidiasis of vulva and vagina: Secondary | ICD-10-CM | POA: Diagnosis not present

## 2018-03-22 DIAGNOSIS — N760 Acute vaginitis: Secondary | ICD-10-CM | POA: Diagnosis not present

## 2018-03-25 DIAGNOSIS — L409 Psoriasis, unspecified: Secondary | ICD-10-CM | POA: Diagnosis not present

## 2018-03-26 DIAGNOSIS — Z01419 Encounter for gynecological examination (general) (routine) without abnormal findings: Secondary | ICD-10-CM | POA: Diagnosis not present

## 2018-03-26 DIAGNOSIS — Z6835 Body mass index (BMI) 35.0-35.9, adult: Secondary | ICD-10-CM | POA: Diagnosis not present

## 2018-04-24 DIAGNOSIS — L930 Discoid lupus erythematosus: Secondary | ICD-10-CM | POA: Diagnosis not present

## 2018-05-10 DIAGNOSIS — N760 Acute vaginitis: Secondary | ICD-10-CM | POA: Diagnosis not present

## 2018-05-15 DIAGNOSIS — L930 Discoid lupus erythematosus: Secondary | ICD-10-CM | POA: Diagnosis not present

## 2018-06-04 DIAGNOSIS — B373 Candidiasis of vulva and vagina: Secondary | ICD-10-CM | POA: Diagnosis not present

## 2018-06-17 DIAGNOSIS — L089 Local infection of the skin and subcutaneous tissue, unspecified: Secondary | ICD-10-CM | POA: Diagnosis not present

## 2018-06-17 DIAGNOSIS — L818 Other specified disorders of pigmentation: Secondary | ICD-10-CM | POA: Diagnosis not present

## 2018-08-07 NOTE — Progress Notes (Signed)
Office Visit Note  Patient: [name]             Date of Birth: 1981-01-11           MRN: [id number]             PCP: Juluis Rainier, MD Referring: Nita Sells, MD Visit Date: 08/21/2018 Occupation: Detention officer  Subjective:  Rash and positive ANA   History of Present Illness: [name] is a 38 y.o. female consultation per request of Dr. Margo Aye.  According to patient her symptoms started about 4 to 5 years ago with rash on her face.  She states the rash will appears as a red pruritic spot in which he could eventually will turn dark and flaky.  She tried using over-the-counter products.  The rash will come and go.  Gradually the rash spread to different parts of her face and between her eyebrows and her ears.  She noticed a rash on her left hand in December and was seen by Dr. Margo Aye.  She states initially it was red swollen and itchy and then became dark.  Dr. Margo Aye prescribed some topical agents and also did lab work.  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  She has a new lesion on her left thumb now.  She denies any history of joint swelling.  She states she has some discomfort in her left shoulder she has lower extremity discomfort and also history of carpal tunnel syndrome for which she uses carpal tunnel braces.  Activities of Daily Living:  Patient reports morning stiffness for 0 minute.   Patient Denies nocturnal pain.  Difficulty dressing/grooming: Denies Difficulty climbing stairs: Denies Difficulty getting out of chair: Denies Difficulty using hands for taps, buttons, cutlery, and/or writing: Denies  Review of Systems  Constitutional: Negative for fatigue, night sweats, weight gain and weight loss.  HENT: Negative for mouth sores, trouble swallowing, trouble swallowing, mouth dryness and nose dryness.   Eyes: Positive for dryness. Negative for pain, redness and visual disturbance.  Respiratory: Negative for cough,  shortness of breath and difficulty breathing.   Cardiovascular: Negative for chest pain, palpitations, hypertension, irregular heartbeat and swelling in legs/feet.  Gastrointestinal: Negative for blood in stool, constipation and diarrhea.  Endocrine: Negative for increased urination.  Genitourinary: Negative for vaginal dryness.  Musculoskeletal: Positive for arthralgias and joint pain. Negative for joint swelling, myalgias, muscle weakness, morning stiffness, muscle tenderness and myalgias.  Skin: Positive for rash. Negative for color change, hair loss, skin tightness, ulcers and sensitivity to sunlight.  Allergic/Immunologic: Negative for susceptible to infections.  Neurological: Negative for dizziness, memory loss, night sweats and weakness.  Hematological: Negative for swollen glands.  Psychiatric/Behavioral: Negative for depressed mood and sleep disturbance. The patient is not nervous/anxious.     PMFS History:  Patient Active Problem List   Diagnosis Date Noted  . Hx of migraines 08/21/2018  . Prediabetes 08/21/2018  . Vitamin D deficiency 08/21/2018  . History of carpal tunnel syndrome 08/21/2018    Past Medical History:  Diagnosis Date  . Healthy adult   . Migraine     Family History  Problem Relation Age of Onset  . Hypertension Mother   . Diabetes Mother   . Asthma Mother   . Asthma Father   . Heart attack Father   . Hypertension Brother   . Heart attack Brother   . Asthma Brother   . Healthy Son   . Healthy Daughter    Past  Surgical History:  Procedure Laterality Date  . ABLATION    . CHOLECYSTECTOMY    . TONSILLECTOMY    . TUBAL LIGATION     Social History   Social History Narrative   Lives with two children   Drinks caffeine every once in a while     There is no immunization history on file for this patient.   Objective: Vital Signs: BP 121/77 (BP Location: Right Arm, Patient Position: Sitting, Cuff Size: Large)   Pulse 77   Resp 14   Ht 5\' 2"   (1.575 m)   Wt 195 lb (88.5 kg)   BMI 35.67 kg/m    Physical Exam Vitals signs and nursing note reviewed.  Constitutional:      Appearance: She is well-developed.  HENT:     Head: Normocephalic and atraumatic.  Eyes:     Conjunctiva/sclera: Conjunctivae normal.  Neck:     Musculoskeletal: Normal range of motion.  Cardiovascular:     Rate and Rhythm: Normal rate and regular rhythm.     Heart sounds: Normal heart sounds.  Pulmonary:     Effort: Pulmonary effort is normal.     Breath sounds: Normal breath sounds.  Abdominal:     General: Bowel sounds are normal.     Palpations: Abdomen is soft.  Lymphadenopathy:     Cervical: No cervical adenopathy.  Skin:    General: Skin is warm and dry.     Capillary Refill: Capillary refill takes less than 2 seconds.     Comments: Hyperpigmented lesion noted on her left ear pinna, dorsum of her left hand, left thumb.  Mild erythema noticed over glabella and tip of her nose.  Neurological:     Mental Status: She is alert and oriented to person, place, and time.  Psychiatric:        Behavior: Behavior normal.      Musculoskeletal Exam: C-spine thoracic lumbar spine good range of motion.  Shoulder joints elbow joints wrist joint MCPs PIPs DIPs been good range of motion with no synovitis.  She has painful abduction of her left shoulder with some tenderness over subacromial area.  Hip joints knee joints ankles MTPs PIPs been good range of motion with no synovitis.  CDAI Exam: CDAI Score: Not documented Patient Global Assessment: Not documented; Provider Global Assessment: Not documented Swollen: Not documented; Tender: Not documented Joint Exam   Not documented   There is currently no information documented on the homunculus. Go to the Rheumatology activity and complete the homunculus joint exam.  Investigation: Findings:  04/24/18:ANA 1:320 H, 1:80 Speckled, dsDNA <1, RNP <0.2, smith <0.2, Scl-70 <0.2, Ro-, La-, Antichomatin-,  Anti-Jo-, anticentromere-  Bx 06/17/18: sparse perivascular lymphocytic infiltrate w/ melanocytes in papillary dermis-consistent w/ inflammatory or post inflammatory pigmentary alteration   Imaging: Xr Shoulder Left  Result Date: 08/21/2018 No glenohumeral or acromioclavicular joint space narrowing was noted.  No chondrocalcinosis was noted. Unremarkable x-ray of the shoulder joint.   Recent Labs: Lab Results  Component Value Date   WBC 5.1 09/25/2014   HGB 15.0 09/25/2014   PLT 180 09/25/2014   NA 138 09/25/2014   K 3.5 09/25/2014   CL 106 09/25/2014   CO2 25 09/25/2014   GLUCOSE 106 (H) 09/25/2014   BUN 9 09/25/2014   CREATININE 0.80 09/25/2014   BILITOT 0.1 (L) 09/25/2014   ALKPHOS 55 09/25/2014   AST 18 09/25/2014   ALT 16 09/25/2014   PROT 7.3 09/25/2014   ALBUMIN 3.5 09/25/2014  CALCIUM 9.2 09/25/2014   GFRAA 89 (L) 09/25/2014    Speciality Comments: No specialty comments available.  Procedures:  No procedures performed Allergies: Pineapple and Biaxin [clarithromycin]   Assessment / Plan:     Visit Diagnoses: Positive ANA (antinuclear antibody) - 04/24/18:ANA 1:320 H, 1:80 Speckled, dsDNA <1, RNP <0.2, smith <0.2, Scl-70 <0.2, Ro-, La-, Antichomatin-, Anti-Jo-, anticentromere- -patient has positive ANA and positive family history of autoimmune disease.  Besides rash she has no other symptoms of autoimmune disease.  I will obtain additional labs today.  Plan: CBC with Differential/Platelet, COMPLETE METABOLIC PANEL WITH GFR, Urinalysis, Routine w reflex microscopic, CK, Sedimentation rate, C3 and C4, Beta-2 glycoprotein antibodies, Cardiolipin antibodies, IgG, IgM, IgA, Lupus Anticoagulant Eval w/Reflex  Rash - Bx 06/17/18: sparse perivascular lymphocytic infiltrate w/ melanocytes in papillary dermis-consistent w/ inflammatory or post inflammatory pigmentary alteration -I discussed lab results with Dr. Margo AyeHall today.  He was in agreement that the findings were nonspecific.   If patient develops a new rash she may need biopsy.  Use of sunscreen on regular basis was discussed.  I have advised her to avoid omeprazole.  Plan: Pan-ANCA  Chronic left shoulder pain - Plan: XR Shoulder Left.  The x-ray was unremarkable.  The clinical findings are consistent with subacromial bursitis.  I offered cortisone injection which she declined.  Have given her a prescription for Voltaren gel.  Side effects were discussed.  A handout on shoulder joint exercises was given.  History of carpal tunnel syndrome -patient gives history of carpal tunnel syndrome for many years.  She states her symptoms are mild and she uses CTbrace  Vitamin D deficiency-she has had history of vitamin D deficiency in the past.  Prediabetes  Hx of migraines-she has intermittent headaches.  Family history of lupus erythematosus - First cousin died from the complications of lupus per patient.  She states her mother has Raynolds.  Orders: Orders Placed This Encounter  Procedures  . XR Shoulder Left  . CBC with Differential/Platelet  . COMPLETE METABOLIC PANEL WITH GFR  . Urinalysis, Routine w reflex microscopic  . CK  . Sedimentation rate  . C3 and C4  . Beta-2 glycoprotein antibodies  . Cardiolipin antibodies, IgG, IgM, IgA  . Lupus Anticoagulant Eval w/Reflex  . Pan-ANCA   Meds ordered this encounter  Medications  . diclofenac sodium (VOLTAREN) 1 % GEL    Sig: Apply 2 to 4 grams topically to affected joint up to four times a day.    Dispense:  4 Tube    Refill:  2    Face-to-face time spent with patient was 50 minutes. Greater than 50% of time was spent in counseling and coordination of care.  Follow-Up Instructions: Return for rash , +ANA.   Pollyann SavoyShaili Everlee Quakenbush, MD  Note - This record has been created using Animal nutritionistDragon software.  Chart creation errors have been sought, but may not always  have been located. Such creation errors do not reflect on  the standard of medical care.

## 2018-08-13 DIAGNOSIS — N760 Acute vaginitis: Secondary | ICD-10-CM | POA: Diagnosis not present

## 2018-08-13 DIAGNOSIS — R350 Frequency of micturition: Secondary | ICD-10-CM | POA: Diagnosis not present

## 2018-08-13 DIAGNOSIS — B373 Candidiasis of vulva and vagina: Secondary | ICD-10-CM | POA: Diagnosis not present

## 2018-08-21 ENCOUNTER — Ambulatory Visit: Payer: 59 | Admitting: Rheumatology

## 2018-08-21 ENCOUNTER — Other Ambulatory Visit: Payer: Self-pay

## 2018-08-21 ENCOUNTER — Ambulatory Visit (INDEPENDENT_AMBULATORY_CARE_PROVIDER_SITE_OTHER): Payer: 59

## 2018-08-21 ENCOUNTER — Encounter: Payer: Self-pay | Admitting: Rheumatology

## 2018-08-21 VITALS — BP 121/77 | HR 77 | Resp 14 | Ht 62.0 in | Wt 195.0 lb

## 2018-08-21 DIAGNOSIS — M25512 Pain in left shoulder: Secondary | ICD-10-CM

## 2018-08-21 DIAGNOSIS — R7303 Prediabetes: Secondary | ICD-10-CM | POA: Insufficient documentation

## 2018-08-21 DIAGNOSIS — Z8669 Personal history of other diseases of the nervous system and sense organs: Secondary | ICD-10-CM | POA: Diagnosis not present

## 2018-08-21 DIAGNOSIS — R21 Rash and other nonspecific skin eruption: Secondary | ICD-10-CM | POA: Diagnosis not present

## 2018-08-21 DIAGNOSIS — R768 Other specified abnormal immunological findings in serum: Secondary | ICD-10-CM | POA: Diagnosis not present

## 2018-08-21 DIAGNOSIS — E559 Vitamin D deficiency, unspecified: Secondary | ICD-10-CM

## 2018-08-21 DIAGNOSIS — G8929 Other chronic pain: Secondary | ICD-10-CM | POA: Diagnosis not present

## 2018-08-21 DIAGNOSIS — Z840 Family history of diseases of the skin and subcutaneous tissue: Secondary | ICD-10-CM

## 2018-08-21 MED ORDER — DICLOFENAC SODIUM 1 % TD GEL: TRANSDERMAL | 2 refills | Status: DC

## 2018-08-21 NOTE — Patient Instructions (Signed)
Shoulder Exercises Ask your health care provider which exercises are safe for you. Do exercises exactly as told by your health care provider and adjust them as directed. It is normal to feel mild stretching, pulling, tightness, or discomfort as you do these exercises, but you should stop right away if you feel sudden pain or your pain gets worse.Do not begin these exercises until told by your health care provider. Range of Motion Exercises        These exercises warm up your muscles and joints and improve the movement and flexibility of your shoulder. These exercises also help to relieve pain, numbness, and tingling. These exercises involve stretching your injured shoulder directly. Exercise A: Pendulum 1. Stand near a wall or a surface that you can hold onto for balance. 2. Bend at the waist and let your left / right arm hang straight down. Use your other arm to support you. Keep your back straight and do not lock your knees. 3. Relax your left / right arm and shoulder muscles, and move your hips and your trunk so your left / right arm swings freely. Your arm should swing because of the motion of your body, not because you are using your arm or shoulder muscles. 4. Keep moving your body so your arm swings in the following directions, as told by your health care provider: ? Side to side. ? Forward and backward. ? In clockwise and counterclockwise circles. 5. Continue each motion for __________ seconds, or for as long as told by your health care provider. 6. Slowly return to the starting position. Repeat __________ times. Complete this exercise __________ times a day. Exercise B:Flexion, Standing 1. Stand and hold a broomstick, a cane, or a similar object. Place your hands a little more than shoulder-width apart on the object. Your left / right hand should be palm-up, and your other hand should be palm-down. 2. Keep your elbow straight and keep your shoulder muscles relaxed. Push the stick  down with your healthy arm to raise your left / right arm in front of your body, and then over your head until you feel a stretch in your shoulder. ? Avoid shrugging your shoulder while you raise your arm. Keep your shoulder blade tucked down toward the middle of your back. 3. Hold for __________ seconds. 4. Slowly return to the starting position. Repeat __________ times. Complete this exercise __________ times a day. Exercise C: Abduction, Standing 1. Stand and hold a broomstick, a cane, or a similar object. Place your hands a little more than shoulder-width apart on the object. Your left / right hand should be palm-up, and your other hand should be palm-down. 2. While keeping your elbow straight and your shoulder muscles relaxed, push the stick across your body toward your left / right side. Raise your left / right arm to the side of your body and then over your head until you feel a stretch in your shoulder. ? Do not raise your arm above shoulder height, unless your health care provider tells you to do that. ? Avoid shrugging your shoulder while you raise your arm. Keep your shoulder blade tucked down toward the middle of your back. 3. Hold for __________ seconds. 4. Slowly return to the starting position. Repeat __________ times. Complete this exercise __________ times a day. Exercise D:Internal Rotation 1. Place your left / right hand behind your back, palm-up. 2. Use your other hand to dangle an exercise band, a towel, or a similar object over your shoulder.   Grasp the band with your left / right hand so you are holding onto both ends. 3. Gently pull up on the band until you feel a stretch in the front of your left / right shoulder. ? Avoid shrugging your shoulder while you raise your arm. Keep your shoulder blade tucked down toward the middle of your back. 4. Hold for __________ seconds. 5. Release the stretch by letting go of the band and lowering your hands. Repeat __________ times.  Complete this exercise __________ times a day. Stretching Exercises  These exercises warm up your muscles and joints and improve the movement and flexibility of your shoulder. These exercises also help to relieve pain, numbness, and tingling. These exercises are done using your healthy shoulder to help stretch the muscles of your injured shoulder. Exercise E: Corner Stretch (External Rotation and Abduction) 1. Stand in a doorway with one of your feet slightly in front of the other. This is called a staggered stance. If you cannot reach your forearms to the door frame, stand facing a corner of a room. 2. Choose one of the following positions as told by your health care provider: ? Place your hands and forearms on the door frame above your head. ? Place your hands and forearms on the door frame at the height of your head. ? Place your hands on the door frame at the height of your elbows. 3. Slowly move your weight onto your front foot until you feel a stretch across your chest and in the front of your shoulders. Keep your head and chest upright and keep your abdominal muscles tight. 4. Hold for __________ seconds. 5. To release the stretch, shift your weight to your back foot. Repeat __________ times. Complete this stretch __________ times a day. Exercise F:Extension, Standing 1. Stand and hold a broomstick, a cane, or a similar object behind your back. ? Your hands should be a little wider than shoulder-width apart. ? Your palms should face away from your back. 2. Keeping your elbows straight and keeping your shoulder muscles relaxed, move the stick away from your body until you feel a stretch in your shoulder. ? Avoid shrugging your shoulders while you move the stick. Keep your shoulder blade tucked down toward the middle of your back. 3. Hold for __________ seconds. 4. Slowly return to the starting position. Repeat __________ times. Complete this exercise __________ times a  day. Strengthening Exercises           These exercises build strength and endurance in your shoulder. Endurance is the ability to use your muscles for a long time, even after they get tired. Exercise G:External Rotation 1. Sit in a stable chair without armrests. 2. Secure an exercise band at elbow height on your left / right side. 3. Place a soft object, such as a folded towel or a small pillow, between your left / right upper arm and your body to move your elbow a few inches away (about 10 cm) from your side. 4. Hold the end of the band so it is tight and there is no slack. 5. Keeping your elbow pressed against the soft object, move your left / right forearm out, away from your abdomen. Keep your body steady so only your forearm moves. 6. Hold for __________ seconds. 7. Slowly return to the starting position. Repeat __________ times. Complete this exercise __________ times a day. Exercise H:Shoulder Abduction 1. Sit in a stable chair without armrests, or stand. 2. Hold a __________ weight in your   left / right hand, or hold an exercise band with both hands. 3. Start with your arms straight down and your left / right palm facing in, toward your body. 4. Slowly lift your left / right hand out to your side. Do not lift your hand above shoulder height unless your health care provider tells you that this is safe. ? Keep your arms straight. ? Avoid shrugging your shoulder while you do this movement. Keep your shoulder blade tucked down toward the middle of your back. 5. Hold for __________ seconds. 6. Slowly lower your arm, and return to the starting position. Repeat __________ times. Complete this exercise __________ times a day. Exercise I:Shoulder Extension 1. Sit in a stable chair without armrests, or stand. 2. Secure an exercise band to a stable object in front of you where it is at shoulder height. 3. Hold one end of the exercise band in each hand. Your palms should face each  other. 4. Straighten your elbows and lift your hands up to shoulder height. 5. Step back, away from the secured end of the exercise band, until the band is tight and there is no slack. 6. Squeeze your shoulder blades together as you pull your hands down to the sides of your thighs. Stop when your hands are straight down by your sides. Do not let your hands go behind your body. 7. Hold for __________ seconds. 8. Slowly return to the starting position. Repeat __________ times. Complete this exercise __________ times a day. Exercise J:Standing Shoulder Row 1. Sit in a stable chair without armrests, or stand. 2. Secure an exercise band to a stable object in front of you so it is at waist height. 3. Hold one end of the exercise band in each hand. Your palms should be in a thumbs-up position. 4. Bend each of your elbows to an "L" shape (about 90 degrees) and keep your upper arms at your sides. 5. Step back until the band is tight and there is no slack. 6. Slowly pull your elbows back behind you. 7. Hold for __________ seconds. 8. Slowly return to the starting position. Repeat __________ times. Complete this exercise __________ times a day. Exercise K:Shoulder Press-Ups 1. Sit in a stable chair that has armrests. Sit upright, with your feet flat on the floor. 2. Put your hands on the armrests so your elbows are bent and your fingers are pointing forward. Your hands should be about even with the sides of your body. 3. Push down on the armrests and use your arms to lift yourself off of the chair. Straighten your elbows and lift yourself up as much as you comfortably can. ? Move your shoulder blades down, and avoid letting your shoulders move up toward your ears. ? Keep your feet on the ground. As you get stronger, your feet should support less of your body weight as you lift yourself up. 4. Hold for __________ seconds. 5. Slowly lower yourself back into the chair. Repeat __________ times. Complete  this exercise __________ times a day. Exercise L: Wall Push-Ups 1. Stand so you are facing a stable wall. Your feet should be about one arm-length away from the wall. 2. Lean forward and place your palms on the wall at shoulder height. 3. Keep your feet flat on the floor as you bend your elbows and lean forward toward the wall. 4. Hold for __________ seconds. 5. Straighten your elbows to push yourself back to the starting position. Repeat __________ times. Complete this exercise __________ times   a day. This information is not intended to replace advice given to you by your health care provider. Make sure you discuss any questions you have with your health care provider. Document Released: 05/15/2005 Document Revised: 11/04/2017 Document Reviewed: 03/12/2015 Elsevier Interactive Patient Education  2019 [employer]  

## 2018-08-21 NOTE — Progress Notes (Signed)
Pharmacy Note Patient presents to [facility] for new patient consultation with Dr. Corliss Skainseveshwar. Patient seen by the pharmacist for counseling on Voltaren gel for shoulder pain.  Has patient tried NSAID's previously?  Yes ibuprofen and naproxen  Patient on the purpose, proper use, and adverse effects of Voltaren gel including headache, increased blood pressure, and risk of GI bleed.  Instructed patient to avoid applying to open skin wound, or on areas of infection, rash, burn, or peeling skin.  Advised  patient wait at least 10 minutes before dressing or wearing gloves and wait at least 1 hour before you bathe or shower.  Counseled patient to wash hands after application and avoid contact with face/eyes.  Advised patient to apply with q-tip if applying to hands to minimize absorption on palms.  Patient given GoodRx coupon to help with cost as it is not routinely covered by insurance.  All questions encouraged and answered.  Instructed patient to call with any further questions or concerns.  Verlin FesterAmber Raheel Kunkle, PharmD, [facility]ACP Rheumatology Clinical Pharmacist  08/21/2018 9:23 AM

## 2018-08-25 LAB — COMPLETE METABOLIC PANEL WITH GFR
AG RATIO: 1.5 (calc) (ref 1.0–2.5)
ALT: 11 U/L (ref 6–29)
AST: 12 U/L (ref 10–30)
Albumin: 4.3 g/dL (ref 3.6–5.1)
Alkaline phosphatase (APISO): 59 U/L (ref 31–125)
BUN: 13 mg/dL (ref 7–25)
CO2: 25 mmol/L (ref 20–32)
Calcium: 9.4 mg/dL (ref 8.6–10.2)
Chloride: 103 mmol/L (ref 98–110)
Creat: 0.84 mg/dL (ref 0.50–1.10)
GFR, Est African American: 101 mL/min/{1.73_m2} (ref >=60)
GFR, Est Non African American: 88 mL/min/{1.73_m2} (ref >=60)
Globulin: 2.8 g/dL (calc) (ref 1.9–3.7)
Glucose, Bld: 87 mg/dL (ref 65–99)
Potassium: 3.8 mmol/L (ref 3.5–5.3)
Sodium: 138 mmol/L (ref 135–146)
Total Bilirubin: 0.2 mg/dL (ref 0.2–1.2)
Total Protein: 7.1 g/dL (ref 6.1–8.1)

## 2018-08-25 LAB — LUPUS ANTICOAGULANT EVAL W/ REFLEX
PTT-LA Screen: 44 s — ABNORMAL HIGH (ref <=40)
dRVVT: 41 s (ref <=45)

## 2018-08-25 LAB — CBC WITH DIFFERENTIAL/PLATELET
Absolute Monocytes: 487 cells/uL (ref 200–950)
Basophils Absolute: 67 cells/uL (ref 0–200)
Basophils Relative: 0.8 %
EOS ABS: 269 {cells}/uL (ref 15–500)
Eosinophils Relative: 3.2 %
HCT: 38.7 % (ref 35.0–45.0)
Hemoglobin: 12.7 g/dL (ref 11.7–15.5)
Lymphs Abs: 3587 cells/uL (ref 850–3900)
MCH: 26.8 pg — AB (ref 27.0–33.0)
MCHC: 32.8 g/dL (ref 32.0–36.0)
MCV: 81.8 fL (ref 80.0–100.0)
MPV: 12.6 fL — ABNORMAL HIGH (ref 7.5–12.5)
Monocytes Relative: 5.8 %
Neutro Abs: 3990 cells/uL (ref 1500–7800)
Neutrophils Relative %: 47.5 %
Platelets: 231 10*3/uL (ref 140–400)
RBC: 4.73 10*6/uL (ref 3.80–5.10)
RDW: 12.5 % (ref 11.0–15.0)
TOTAL LYMPHOCYTE: 42.7 %
WBC: 8.4 10*3/uL (ref 3.8–10.8)

## 2018-08-25 LAB — CARDIOLIPIN ANTIBODIES, IGG, IGM, IGA
Anticardiolipin IgA: <11 [APL'U]
Anticardiolipin IgG: <14 [GPL'U]
Anticardiolipin IgM: <12 [MPL'U]

## 2018-08-25 LAB — BETA-2 GLYCOPROTEIN ANTIBODIES
Beta-2 Glyco 1 IgA: <9 SAU (ref <=20)
Beta-2 Glyco 1 IgM: <9 SMU (ref <=20)
Beta-2 Glyco I IgG: <9 SGU (ref <=20)

## 2018-08-25 LAB — URINALYSIS, ROUTINE W REFLEX MICROSCOPIC
Bacteria, UA: NONE SEEN /HPF
Bilirubin Urine: NEGATIVE
Glucose, UA: NEGATIVE
Hyaline Cast: NONE SEEN /LPF
Ketones, ur: NEGATIVE
Leukocytes,Ua: NEGATIVE
NITRITE: NEGATIVE
Protein, ur: NEGATIVE
SQUAMOUS EPITHELIAL / LPF: NONE SEEN /HPF (ref <=5)
Specific Gravity, Urine: 1.028 (ref 1.001–1.03)
WBC, UA: NONE SEEN /HPF (ref 0–5)
pH: <=5 (ref 5.0–8.0)

## 2018-08-25 LAB — CK: CK TOTAL: 226 U/L — AB (ref 29–143)

## 2018-08-25 LAB — RFLX HEXAGONAL PHASE CONFIRM: Hexagonal Phase Conf: NEGATIVE

## 2018-08-25 LAB — SEDIMENTATION RATE: Sed Rate: 31 mm/h — ABNORMAL HIGH (ref 0–20)

## 2018-08-25 LAB — PAN-ANCA
ANCA Screen: NEGATIVE
Serine Protease 3: <1 AI

## 2018-08-25 LAB — C3 AND C4
C3 Complement: 152 mg/dL (ref 83–193)
C4 Complement: 44 mg/dL (ref 15–57)

## 2018-08-25 NOTE — Progress Notes (Signed)
I will discuss labs at the follow-up visit.

## 2018-08-31 DIAGNOSIS — N898 Other specified noninflammatory disorders of vagina: Secondary | ICD-10-CM | POA: Diagnosis not present

## 2018-09-03 NOTE — Progress Notes (Signed)
Office Visit Note  Patient: [name]             Date of Birth: 1980-07-20           MRN: [id number]             PCP: Leighton Ruff, MD Referring: No ref. provider found Visit Date: 09/16/2018 Occupation: '@GUAROCC' @  Subjective:  Rash, right knee pain.  History of Present Illness: [name] is a 38 y.o. female with history of positive ANA and elevated CK.  She reports that she continues to have spots of hyperpigmentation on her left hand and face/ears intermittently.  She states the rash will occasionally itch and become painful and swollen.  She uses betamethasone topically.  She denies any new lesions.  She denies any photosensitivity. She denies any sores in mouth or nose.  She denies any symptoms of Raynaud's.  She reports eye dryness but no mouth dryness. She reports she continues to have left shoulder and right knee pain.  She has not tried using voltaren gel but is planning on it.  She denies any joint swelling.  She does experience morning stiffness and stiffness after sitting for prolonged periods of time. She has no muscle weakness but has muscle tenderness in bilateral lower extremities.    Activities of Daily Living:  Patient reports morning stiffness for several hours.   Patient Denies nocturnal pain.  Difficulty dressing/grooming: Denies Difficulty climbing stairs: Reports Difficulty getting out of chair: Reports Difficulty using hands for taps, buttons, cutlery, and/or writing: Denies  Review of Systems  Constitutional: Negative for fatigue.  HENT: Negative for mouth sores, mouth dryness and nose dryness.   Eyes: Positive for dryness. Negative for pain, itching and visual disturbance.  Respiratory: Negative for cough, hemoptysis, shortness of breath, wheezing and difficulty breathing.   Cardiovascular: Negative for chest pain, palpitations, hypertension and swelling in legs/feet.  Gastrointestinal: Negative for abdominal pain, blood in stool, constipation  and diarrhea.  Endocrine: Negative for increased urination.  Genitourinary: Negative for painful urination and pelvic pain.  Musculoskeletal: Positive for arthralgias, joint pain and morning stiffness. Negative for joint swelling, myalgias, muscle weakness, muscle tenderness and myalgias.  Skin: Negative for color change, pallor, rash, hair loss, nodules/bumps, redness, skin tightness, ulcers and sensitivity to sunlight.  Allergic/Immunologic: Negative for susceptible to infections.  Neurological: Negative for dizziness, light-headedness, numbness, headaches, memory loss and weakness.  Hematological: Negative for bruising/bleeding tendency and swollen glands.  Psychiatric/Behavioral: Negative for depressed mood, confusion and sleep disturbance. The patient is not nervous/anxious.     PMFS History:  Patient Active Problem List   Diagnosis Date Noted  . Family history of systemic lupus erythematosus 09/16/2018  . Hx of migraines 08/21/2018  . Prediabetes 08/21/2018  . Vitamin D deficiency 08/21/2018  . History of carpal tunnel syndrome 08/21/2018    Past Medical History:  Diagnosis Date  . Healthy adult   . Migraine     Family History  Problem Relation Age of Onset  . Hypertension Mother   . Diabetes Mother   . Asthma Mother   . Asthma Father   . Heart attack Father   . Hypertension Brother   . Heart attack Brother   . Asthma Brother   . Healthy Son   . Healthy Daughter    Past Surgical History:  Procedure Laterality Date  . ABLATION    . CHOLECYSTECTOMY    . TONSILLECTOMY    . TUBAL LIGATION     Social History  Social History Narrative   Lives with two children   Drinks caffeine every once in a while     There is no immunization history on file for this patient.   Objective: Vital Signs: BP 128/75 (BP Location: Right Arm, Patient Position: Sitting, Cuff Size: Large)   Pulse 71   Resp 13   Ht 5' 2.5" (1.588 m)   Wt 192 lb 12.8 oz (87.5 kg)   BMI 34.70 kg/m     Physical Exam Vitals signs and nursing note reviewed.  Constitutional:      Appearance: She is well-developed.  HENT:     Head: Normocephalic and atraumatic.     Comments: No parotid swelling or tenderness.     Nose:     Comments: No nasal ulcerations     Mouth/Throat:     Comments: No oral ulcerations Eyes:     Conjunctiva/sclera: Conjunctivae normal.  Neck:     Musculoskeletal: Normal range of motion.  Cardiovascular:     Rate and Rhythm: Normal rate and regular rhythm.     Heart sounds: Normal heart sounds.  Pulmonary:     Effort: Pulmonary effort is normal.     Breath sounds: Normal breath sounds.  Abdominal:     General: Bowel sounds are normal.     Palpations: Abdomen is soft.  Lymphadenopathy:     Cervical: No cervical adenopathy.  Skin:    General: Skin is warm and dry.     Capillary Refill: Capillary refill takes less than 2 seconds.     Comments: No malar rash noted. No digital ulcerations or signs of gangrene noted. Hyperpigmented lesion on dorsum of left hand, left thumb, and pinna of left ear.  Neurological:     Mental Status: She is alert and oriented to person, place, and time.  Psychiatric:        Behavior: Behavior normal.      Musculoskeletal Exam: C-spine, thoracic spine, and lumbar spine good ROM.  No midline spinal tenderness.  No SI joint tenderness.  Shoulder exam elbow joints, strength, MCPs and PIPs and DIPs good range of motion no synovitis.  She has complete laceration bilaterally.  Hip joints, knee joints, ankle joints, MTPs, PIPs and DIPs good range of motion with no synovitis.  No warmth or effusion bilateral knee joints.  No tenderness or swelling of ankle joints.  No tenderness over trochanter bursa bilaterally.  CDAI Exam: CDAI Score: Not documented Patient Global Assessment: Not documented; Provider Global Assessment: Not documented Swollen: Not documented; Tender: Not documented Joint Exam   Not documented   There is currently no  information documented on the homunculus. Go to the Rheumatology activity and complete the homunculus joint exam.  Investigation: No additional findings.  Imaging: Xr Knee 3 View Right  Result Date: 09/16/2018 Significant knee joint narrowing was noted.  Moderate patellofemoral narrowing was noted. Impression: These findings are consistent with moderate chondromalacia patella.  Xr Shoulder Left  Result Date: 08/21/2018 No glenohumeral or acromioclavicular joint space narrowing was noted.  No chondrocalcinosis was noted. Unremarkable x-ray of the shoulder joint.   Recent Labs: Lab Results  Component Value Date   WBC 8.4 08/21/2018   HGB 12.7 08/21/2018   PLT 231 08/21/2018   NA 138 08/21/2018   K 3.8 08/21/2018   CL 103 08/21/2018   CO2 25 08/21/2018   GLUCOSE 87 08/21/2018   BUN 13 08/21/2018   CREATININE 0.84 08/21/2018   BILITOT 0.2 08/21/2018   ALKPHOS 55 09/25/2014  AST 12 08/21/2018   ALT 11 08/21/2018   PROT 7.1 08/21/2018   ALBUMIN 3.5 09/25/2014   CALCIUM 9.4 08/21/2018   GFRAA 101 08/21/2018  August 21, 2018 UA negative, lupus anticoagulant negative, beta-2 negative, anticardiolipin negative, C3-C4 normal, ANCA negative, CK 226, ESR 31  04/24/18:ANA 1:320 H, 1:80 Speckled, dsDNA <1, RNP <0.2, smith <0.2, Scl-70 <0.2, Ro-, La-, Antichomatin-, Anti-Jo-, anticentromere-  Bx 06/17/18: sparse perivascular lymphocytic infiltrate w/ melanocytes in papillary dermis-consistent w/ inflammatory or post inflammatory pigmentary alteration  Speciality Comments: No specialty comments available.  Procedures:  No procedures performed Allergies: Pineapple and Biaxin [clarithromycin]   Assessment / Plan:     Visit Diagnoses: Positive ANA (antinuclear antibody) - ENA negative, all other autoimmune labs negative -patient has low titer positive ANA no clinical features of autoimmune disease.  I will repeat ANA today.  Plan: ANA  Elevated CK -she is elevated CK.  She complains of  some myalgias but there is no muscle weakness on examination.  I will obtain following labs today.  Her sed rate was also mildly elevated at the last visit.  Plan: CK, Aldolase, Sedimentation rate, TSH  Rash - Bx 06/17/18: sparse perivascular lymphocytic infiltrate w/ melanocytes in papillary dermis-consistent w/ inflammatory or post inflammatory pigmentary alteration.  She has appointment coming up at [facility] dermatology this month.  Chronic pain of right knee -she has been experiencing increased pain and discomfort in her right knee joint.  She complains of difficulty walking.  The x-ray of the knee joint today showed chondromalacia patella.  I have given her a handout on knee exercises.  Plan: XR KNEE 3 VIEW RIGHT.  I advised her to use Voltaren gel.  If her symptoms persist she should notify us.  Chronic left shoulder pain - Subacromial bursitis  History of carpal tunnel syndrome  Vitamin D deficiency  Prediabetes  Hx of migraines  Family history of systemic lupus erythematosus - First cousin, mother has Raynauds   Orders: Orders Placed This Encounter  Procedures  . XR KNEE 3 VIEW RIGHT  . CK  . Aldolase  . Sedimentation rate  . TSH  . ANA   No orders of the defined types were placed in this encounter.   Face-to-face time spent with patient was 30 minutes. Greater than 50% of time was spent in counseling and coordination of care.  Follow-Up Instructions: Return in about 1 month (around 10/17/2018) for Positive ANA, elevated CK.   Bo Merino, MD  Note - This record has been created using Editor, commissioning.  Chart creation errors have been sought, but may not always  have been located. Such creation errors do not reflect on  the standard of medical care.

## 2018-09-16 ENCOUNTER — Encounter: Payer: Self-pay | Admitting: Rheumatology

## 2018-09-16 ENCOUNTER — Ambulatory Visit (INDEPENDENT_AMBULATORY_CARE_PROVIDER_SITE_OTHER): Payer: Self-pay

## 2018-09-16 ENCOUNTER — Ambulatory Visit: Payer: 59 | Admitting: Rheumatology

## 2018-09-16 VITALS — BP 128/75 | HR 71 | Resp 13 | Ht 62.5 in | Wt 192.8 lb

## 2018-09-16 DIAGNOSIS — R768 Other specified abnormal immunological findings in serum: Secondary | ICD-10-CM | POA: Diagnosis not present

## 2018-09-16 DIAGNOSIS — R21 Rash and other nonspecific skin eruption: Secondary | ICD-10-CM | POA: Diagnosis not present

## 2018-09-16 DIAGNOSIS — M25561 Pain in right knee: Secondary | ICD-10-CM

## 2018-09-16 DIAGNOSIS — M25512 Pain in left shoulder: Secondary | ICD-10-CM

## 2018-09-16 DIAGNOSIS — R748 Abnormal levels of other serum enzymes: Secondary | ICD-10-CM

## 2018-09-16 DIAGNOSIS — R7303 Prediabetes: Secondary | ICD-10-CM

## 2018-09-16 DIAGNOSIS — G8929 Other chronic pain: Secondary | ICD-10-CM

## 2018-09-16 DIAGNOSIS — Z8669 Personal history of other diseases of the nervous system and sense organs: Secondary | ICD-10-CM

## 2018-09-16 DIAGNOSIS — Z8269 Family history of other diseases of the musculoskeletal system and connective tissue: Secondary | ICD-10-CM | POA: Insufficient documentation

## 2018-09-16 DIAGNOSIS — E559 Vitamin D deficiency, unspecified: Secondary | ICD-10-CM

## 2018-09-16 NOTE — Patient Instructions (Signed)

## 2018-09-18 LAB — ANA: Anti Nuclear Antibody(ANA): POSITIVE — AB

## 2018-09-18 LAB — ANTI-NUCLEAR AB-TITER (ANA TITER)

## 2018-09-18 LAB — TSH: TSH: 1.5 mIU/L

## 2018-09-18 LAB — SEDIMENTATION RATE: SED RATE: 22 mm/h — AB (ref 0–20)

## 2018-09-18 LAB — CK: Total CK: 183 U/L — ABNORMAL HIGH (ref 29–143)

## 2018-09-18 LAB — ALDOLASE: Aldolase: 4.2 U/L (ref <=8.1)

## 2018-09-21 NOTE — Progress Notes (Signed)
I will discuss labs at the follow-up visit.

## 2018-10-02 DIAGNOSIS — Z0000 Encounter for general adult medical examination without abnormal findings: Secondary | ICD-10-CM | POA: Diagnosis not present

## 2018-10-02 DIAGNOSIS — Z13220 Encounter for screening for lipoid disorders: Secondary | ICD-10-CM | POA: Diagnosis not present

## 2018-10-02 DIAGNOSIS — Z6834 Body mass index (BMI) 34.0-34.9, adult: Secondary | ICD-10-CM | POA: Diagnosis not present

## 2018-10-02 DIAGNOSIS — R7303 Prediabetes: Secondary | ICD-10-CM | POA: Diagnosis not present

## 2018-10-02 DIAGNOSIS — E559 Vitamin D deficiency, unspecified: Secondary | ICD-10-CM | POA: Diagnosis not present

## 2018-10-02 DIAGNOSIS — Z8249 Family history of ischemic heart disease and other diseases of the circulatory system: Secondary | ICD-10-CM | POA: Diagnosis not present

## 2018-10-12 DIAGNOSIS — L219 Seborrheic dermatitis, unspecified: Secondary | ICD-10-CM | POA: Diagnosis not present

## 2018-10-12 DIAGNOSIS — L810 Postinflammatory hyperpigmentation: Secondary | ICD-10-CM | POA: Diagnosis not present

## 2018-10-13 NOTE — Progress Notes (Signed)
Virtual Visit via Video Note  I connected with [name] on 10/13/18 at  8:15 AM EDT by a video enabled telemedicine application and verified that I am speaking with the correct person using two identifiers.   I discussed the limitations of evaluation and management by telemedicine and the availability of in person appointments. The patient expressed understanding and agreed to proceed.   CC:  History of Present Illness: Patient is a 38 year old female with a past medical history of positive ANA, elevated CK, and a rash.  Dr. Evelina Dun 10/12/18  Hydrocortisone 2.5% cream  Intermittent rash on face, ear, and left dorsal hand, over past 7-8 days Redness, itching and flaking during flares  Diagnosed with seborrheic dermatitis and post-inflammatory hyperpigmentation  Review of Systems  Constitutional: Negative for fever and malaise/fatigue.  Eyes: Negative for photophobia, pain, discharge and redness.  Respiratory: Negative for cough, shortness of breath and wheezing.   Cardiovascular: Negative for chest pain and palpitations.  Gastrointestinal: Negative for blood in stool, constipation and diarrhea.  Genitourinary: Negative for dysuria.  Musculoskeletal: Negative for back pain, joint pain, myalgias and neck pain.  Skin: Positive for rash.       Negative Raynaud's Negative photosensitivity or hair loss  Neurological: Negative for dizziness and headaches.  Psychiatric/Behavioral: Negative for depression. The patient is not nervous/anxious and does not have insomnia.      Observations/Objective: Physical Exam  Constitutional: She is oriented to person, place, and time and well-developed, well-nourished, and in no distress.  HENT:  Head: Normocephalic and atraumatic.  Eyes: Conjunctivae are normal.  Pulmonary/Chest: Effort normal.  Neurological: She is alert and oriented to person, place, and time.  Psychiatric: Mood, memory, affect and judgment normal.   Patient  reports morning stiffness for 0 minutes.   Patient denies nocturnal pain.  Difficulty dressing/grooming: Denies Difficulty climbing stairs: Denies Difficulty getting out of chair: Denies Difficulty using hands for taps, buttons, cutlery, and/or writing: Denies  Assessment and Plan: 1. Positive ANA (antinuclear antibody): ANA 1:320 H, 1:80 Speckled, ENA negative, all other autoimmune labs negative: She has no clinical features of autoimmune disease at this time.  We will recheck lab work in 6 months at follow up visit.  She was advised to notify us if she develops any signs or symptoms of autoimmune disease.    2. Elevated CK: She has no muscle weakness or muscle tenderness at this time.  Aldolase was WNL on 09/16/18.  CK is trending down from 226 on 08/21/18 to 183 on 09/16/18.    3. Rash: Bx 06/17/18: sparse perivascular lymphocytic infiltrate w/ melanocytes in papillary dermis-consistent w/ inflammatory or post inflammatory pigmentary alteration:  She continues to have an intermittent rash on her face, ears, and left dorsal hand for the past 7-8 years.  During flares the rash is red, flaky, and itchy.  She was evaluated by Dr. Evelina Dun on 10/12/18 for an initial consult.  She was prescribed Hydrocortisone 2.5% cream to apply topically. She has a follow up appointment in 3 months.  She was advised to continue using the topical cream and to wear sunscreen on a daily basis.  She will notify us if she develops any new or worsening rashes.  4. Chondromalacia of right patella: She has intermittent right knee joint pain.  She has no joint swelling.  She has not tried performing stretching exercises yet.    5. History of carpal tunnel syndrome: Asymptomatic at this time.   6. Vitamin D deficiency: She  takes vitamin D 5,000 units by mouth daily.   7. Family history of systemic lupus erythematosus: +cousin, Mother has family history of Raynaud's.     Follow Up Instructions: She will follow up in 6  months.  She will continue using hydrocortisone 2.5% cream and was advised to wear sunscreen daily.     I discussed the assessment and treatment plan with the patient. The patient was provided an opportunity to ask questions and all were answered. The patient agreed with the plan and demonstrated an understanding of the instructions.   The patient was advised to call back or seek an in-person evaluation if the symptoms worsen or if the condition fails to improve as anticipated.  I provided 15 minutes of non-face-to-face time during this encounter.   Gearldine Bienenstock, PA-C

## 2018-10-16 DIAGNOSIS — B349 Viral infection, unspecified: Secondary | ICD-10-CM | POA: Diagnosis not present

## 2018-10-19 ENCOUNTER — Telehealth (INDEPENDENT_AMBULATORY_CARE_PROVIDER_SITE_OTHER): Payer: 59 | Admitting: Rheumatology

## 2018-10-19 ENCOUNTER — Encounter: Payer: Self-pay | Admitting: Rheumatology

## 2018-10-19 DIAGNOSIS — R748 Abnormal levels of other serum enzymes: Secondary | ICD-10-CM | POA: Diagnosis not present

## 2018-10-19 DIAGNOSIS — M94261 Chondromalacia, right knee: Secondary | ICD-10-CM

## 2018-10-19 DIAGNOSIS — R7303 Prediabetes: Secondary | ICD-10-CM

## 2018-10-19 DIAGNOSIS — Z8739 Personal history of other diseases of the musculoskeletal system and connective tissue: Secondary | ICD-10-CM

## 2018-10-19 DIAGNOSIS — M2241 Chondromalacia patellae, right knee: Secondary | ICD-10-CM

## 2018-10-19 DIAGNOSIS — Z8269 Family history of other diseases of the musculoskeletal system and connective tissue: Secondary | ICD-10-CM

## 2018-10-19 DIAGNOSIS — E559 Vitamin D deficiency, unspecified: Secondary | ICD-10-CM

## 2018-10-19 DIAGNOSIS — R768 Other specified abnormal immunological findings in serum: Secondary | ICD-10-CM | POA: Diagnosis not present

## 2018-10-19 DIAGNOSIS — Z8669 Personal history of other diseases of the nervous system and sense organs: Secondary | ICD-10-CM

## 2018-10-19 DIAGNOSIS — R21 Rash and other nonspecific skin eruption: Secondary | ICD-10-CM

## 2018-10-19 NOTE — Progress Notes (Signed)
Virtual Visit via Telephone Note  I connected with [name] on 10/19/18 at  8:15 AM EDT by telephone and verified that I am speaking with the correct person using two identifiers.   I discussed the limitations, risks, security and privacy concerns of performing an evaluation and management service by telephone and the availability of in person appointments. I also discussed with the patient that there may be a patient responsible charge related to this service. The patient expressed understanding and agreed to proceed.  CC: Rash  History of Present Illness: Patient is a 38 year old female with a past medical history of seborrheic dermatitis, elevated CK, and positive ANA.  She continues to have an intermittent rash on her face, ears, and dorsal aspect of left hand.  She was evaluated by Dr. Evelina Dun at [facility] who prescribed hydrocortisone 2.5% cream.  She will be following up in 3 months.  She denies any joint pain currently.  She has intermittent right knee joint pain.  She denies any joint swelling.  She denies any muscle aches or muscle tenderness.      Review of Systems:  Constitutional: Negative for fever and malaise/fatigue.  Eyes: Negative for photophobia, pain, discharge and redness.  Respiratory: Negative for cough, shortness of breath and wheezing.   Cardiovascular: Negative for chest pain and palpitations.  Gastrointestinal: Negative for blood in stool, constipation and diarrhea.  Genitourinary: Negative for dysuria.  Musculoskeletal: Negative for back pain, joint pain, myalgias and neck pain.  Skin: Positive for rash.       Negative Raynaud's Negative photosensitivity or hair loss  Neurological: Negative for dizziness and headaches.  Psychiatric/Behavioral: Negative for depression. The patient is not nervous/anxious and does not have insomnia.    Observations/Objective: Physical Exam  Constitutional: She is oriented to person, place, and time and  well-developed, well-nourished, and in no distress.  HENT:  Head: Normocephalic and atraumatic.  Eyes: Conjunctivae are normal.  Pulmonary/Chest: Effort normal.  Neurological: She is alert and oriented to person, place, and time.  Psychiatric: Mood, memory, affect and judgment normal.   Patient reports morning stiffness for 0 minutes.   Patient denies nocturnal pain.  Difficulty dressing/grooming: Denies Difficulty climbing stairs: Denies Difficulty getting out of chair: Denies Difficulty using hands for taps, buttons, cutlery, and/or writing: Denies  Assessment and Plan: 1. Positive ANA (antinuclear antibody): ANA 1:320 H, 1:80 Speckled, ENA negative, all other autoimmune labs negative: She has no clinical features of autoimmune disease at this time.  We will recheck lab work in 6 months at follow up visit.  She was advised to notify us if she develops any signs or symptoms of autoimmune disease.    2. Elevated CK: She has no muscle weakness or muscle tenderness at this time.  Aldolase was WNL on 09/16/18.  CK is trending down from 226 on 08/21/18 to 183 on 09/16/18.    3. Rash: Bx 06/17/18: sparse perivascular lymphocytic infiltrate w/ melanocytes in papillary dermis-consistent w/ inflammatory or post inflammatory pigmentary alteration:  She continues to have an intermittent rash on her face, ears, and left dorsal hand for the past 7-8 years.  During flares the rash is red, flaky, and itchy.  She was evaluated by Dr. Evelina Dun on 10/12/18 for an initial consult.  She was prescribed Hydrocortisone 2.5% cream to apply topically. She has a follow up appointment in 3 months.  She was advised to continue using the topical cream and to wear sunscreen on a daily basis.  She will notify us if she develops any new or worsening rashes.  4. Chondromalacia of right patella: She has intermittent right knee joint pain.  She has no joint swelling.  She has not tried performing stretching exercises yet.     5. History of carpal tunnel syndrome: Asymptomatic at this time.   6. Vitamin D deficiency: She takes vitamin D 5,000 units by mouth daily.   7. Family history of systemic lupus erythematosus: +cousin, Mother has family history of Raynaud's.   Follow Up Instructions: She will follow up in 6 months.  She will continue using hydrocortisone 2.5% cream and was advised to wear sunscreen daily.     I discussed the assessment and treatment plan with the patient. The patient was provided an opportunity to ask questions and all were answered. The patient agreed with the plan and demonstrated an understanding of the instructions.   The patient was advised to call back or seek an in-person evaluation if the symptoms worsen or if the condition fails to improve as anticipated.  I provided15 minutes of non-face-to-face time during this encounter.  Pollyann Savoy, MD  Scribed by- Gearldine Bienenstock, PA-C

## 2018-10-20 ENCOUNTER — Telehealth: Payer: Self-pay | Admitting: Rheumatology

## 2018-10-20 NOTE — Telephone Encounter (Signed)
-----   Message from Ellen Henri, CMA sent at 10/19/2018 11:39 AM EDT ----- Patient had a virtual visit today with Dr. Corliss Skains. Please call to schedule 6 month follow up. Thanks!

## 2018-10-20 NOTE — Telephone Encounter (Signed)
I LMOM for patient to  Call, and schedule a 6 month fu appt for 04/2019.

## 2018-12-04 DIAGNOSIS — B373 Candidiasis of vulva and vagina: Secondary | ICD-10-CM | POA: Diagnosis not present

## 2018-12-04 DIAGNOSIS — R350 Frequency of micturition: Secondary | ICD-10-CM | POA: Diagnosis not present

## 2019-01-27 ENCOUNTER — Ambulatory Visit: Payer: 59 | Admitting: Orthopaedic Surgery

## 2019-01-28 ENCOUNTER — Ambulatory Visit: Payer: Self-pay

## 2019-01-28 ENCOUNTER — Ambulatory Visit (INDEPENDENT_AMBULATORY_CARE_PROVIDER_SITE_OTHER): Payer: 59 | Admitting: Orthopaedic Surgery

## 2019-01-28 ENCOUNTER — Encounter: Payer: Self-pay | Admitting: Orthopaedic Surgery

## 2019-01-28 ENCOUNTER — Other Ambulatory Visit: Payer: Self-pay

## 2019-01-28 DIAGNOSIS — M79672 Pain in left foot: Secondary | ICD-10-CM

## 2019-01-28 DIAGNOSIS — M79671 Pain in right foot: Secondary | ICD-10-CM

## 2019-01-28 NOTE — Progress Notes (Signed)
Office Visit Note   Patient: [name]           Date of Birth: 05/13/1981           MRN: [id number] Visit Date: 01/28/2019              Requested by: Lauren Ruff, MD [place],  [place] [id number] PCP: Lauren Ruff, MD   Assessment & Plan: Visit Diagnoses:  1. Pain in right foot   2. Pain in left foot     Plan: Impression is bilateral foot lateral column overloading and pain.  I have recommended the shoe market for accommodative shoes and orthotics that she can put into her work boots.  She will also continue to use the topical medicines that Dr. Estanislado Willis has prescribed her on her feet.  Questions encouraged and answered.  Follow-up as needed.  Follow-Up Instructions: Return if symptoms worsen or fail to improve.   Orders:  Orders Placed This Encounter  Procedures  . XR Foot Complete Left  . XR Foot Complete Right   No orders of the defined types were placed in this encounter.     Procedures: No procedures performed   Clinical Data: No additional findings.   Subjective: Chief Complaint  Patient presents with  . Right Foot - Pain  . Left Foot - Pain    She is a very pleasant 38 year old female who works as a Corporate treasurer at [employer] who comes in for evaluation of bilateral foot pain worse on the left for the last 2 months.  No definite injuries.  The pain is worse with weightbearing and standing on her feet all day.  She has been taking over-the-counter Tylenol without significant relief.  She is positive for ANA and she sees Dr. Estanislado Willis.  Denies any swelling.   Review of Systems  Constitutional: Negative.   HENT: Negative.   Eyes: Negative.   Respiratory: Negative.   Cardiovascular: Negative.   Endocrine: Negative.   Musculoskeletal: Negative.   Neurological: Negative.   Hematological: Negative.   Psychiatric/Behavioral: Negative.   All other systems reviewed and are negative.    Objective: Vital Signs:  There were no vitals taken for this visit.  Physical Exam Vitals signs and nursing note reviewed.  Constitutional:      Appearance: She is well-developed.  HENT:     Head: Normocephalic and atraumatic.  Neck:     Musculoskeletal: Neck supple.  Pulmonary:     Effort: Pulmonary effort is normal.  Abdominal:     Palpations: Abdomen is soft.  Skin:    General: Skin is warm.     Capillary Refill: Capillary refill takes less than 2 seconds.  Neurological:     Mental Status: She is alert and oriented to person, place, and time.  Psychiatric:        Behavior: Behavior normal.        Thought Content: Thought content normal.        Judgment: Judgment normal.     Ortho Exam Bilateral foot exam shows tenderness along the lateral column.  She has mild tenderness along the Achilles.  Good ankle plantar and dorsiflexion.  Subtalar motion is normal and painless.  Peroneal tendons are stable and unremarkable. Specialty Comments:  No specialty comments available.  Imaging: Xr Foot Complete Left  Result Date: 01/28/2019 No acute or structural abnormalities.  Xr Foot Complete Right  Result Date: 01/28/2019 No acute or structural abnormalities.    PMFS History: Patient  Active Problem List   Diagnosis Date Noted  . Family history of systemic lupus erythematosus 09/16/2018  . Hx of migraines 08/21/2018  . Prediabetes 08/21/2018  . Vitamin D deficiency 08/21/2018  . History of carpal tunnel syndrome 08/21/2018   Past Medical History:  Diagnosis Date  . Healthy adult   . Migraine     Family History  Problem Relation Age of Onset  . Hypertension Mother   . Diabetes Mother   . Asthma Mother   . Asthma Father   . Heart attack Father   . Hypertension Brother   . Heart attack Brother   . Asthma Brother   . Healthy Son   . Healthy Daughter     Past Surgical History:  Procedure Laterality Date  . ABLATION    . CHOLECYSTECTOMY    . TONSILLECTOMY    . TUBAL LIGATION      Social History   Occupational History  . Occupation: Schools  Tobacco Use  . Smoking status: Never Smoker  . Smokeless tobacco: Never Used  Substance and Sexual Activity  . Alcohol use: No  . Drug use: Never  . Sexual activity: Not on file

## 2019-04-19 NOTE — Progress Notes (Signed)
Office Visit Note  Patient: [name]             Date of Birth: 1980-10-12           MRN: [id number]             PCP: Leighton Ruff, MD Referring: Leighton Ruff, MD Visit Date: 05/03/2019 Occupation: @GUAROCC @  Subjective:  Body aches   History of Present Illness: [name] is a 38 y.o. female with history of positive ANA and elevated CK. She presents today experiencing increased myalgias and joint stiffness. She has not noticed any muscle weakness. She is having increased pain in the left shoulder and bilateral knee joints.  She states the pain in the right knee joint is constant and the discomfort in the left knee joint is intermittent.  She states the left shoulder pain is intermittent.  She uses voltaren gel topically for pain relief.  She states she works 12-hour shifts several days a week, and after working she experiences increased fatigue and myalgias.   She has been sleeping well. She has not had any difficulty getting up from a chair or raising her arms above her head.   She denies any recurrence of the rash.  She is no longer using hydrocoritone cream.  She states if the rash returns she plans on returning to the dermatologist for a biopsy. She denies any oral or nasal ulcerations.     Activities of Daily Living:  Patient reports morning stiffness for 5-10 minutes.   Patient Reports nocturnal pain. Difficulty dressing/grooming: Reports Difficulty climbing stairs: Denies Difficulty getting out of chair: Denies Difficulty using hands for taps, buttons, cutlery, and/or writing: Denies  Review of Systems  Constitutional: Negative for fatigue.  HENT: Negative for mouth sores, mouth dryness and nose dryness.   Eyes: Positive for dryness. Negative for pain, itching and visual disturbance.  Respiratory: Negative for cough, hemoptysis, shortness of breath, wheezing and difficulty breathing.   Cardiovascular: Negative for chest pain, palpitations, hypertension  and swelling in legs/feet.  Gastrointestinal: Negative for blood in stool, constipation and diarrhea.  Endocrine: Negative for increased urination.  Genitourinary: Negative for difficulty urinating and painful urination.  Musculoskeletal: Positive for arthralgias, joint pain, morning stiffness and muscle tenderness. Negative for joint swelling, myalgias, muscle weakness and myalgias.  Skin: Negative for color change, pallor, rash, hair loss, nodules/bumps, skin tightness, ulcers and sensitivity to sunlight.  Allergic/Immunologic: Negative for susceptible to infections.  Neurological: Negative for dizziness, light-headedness, numbness, headaches, memory loss and weakness.  Hematological: Negative for bruising/bleeding tendency and swollen glands.  Psychiatric/Behavioral: Negative for depressed mood, confusion and sleep disturbance. The patient is not nervous/anxious.     PMFS History:  Patient Active Problem List   Diagnosis Date Noted  . Family history of systemic lupus erythematosus 09/16/2018  . Hx of migraines 08/21/2018  . Prediabetes 08/21/2018  . Vitamin D deficiency 08/21/2018  . History of carpal tunnel syndrome 08/21/2018    Past Medical History:  Diagnosis Date  . Healthy adult   . Migraine     Family History  Problem Relation Age of Onset  . Hypertension Mother   . Diabetes Mother   . Asthma Mother   . Asthma Father   . Heart attack Father   . Hypertension Brother   . Heart attack Brother   . Asthma Brother   . Healthy Son   . Healthy Daughter    Past Surgical History:  Procedure Laterality Date  . ABLATION    .  CHOLECYSTECTOMY    . TONSILLECTOMY    . TUBAL LIGATION     Social History   Social History Narrative   Lives with two children   Drinks caffeine every once in a while     There is no immunization history on file for this patient.   Objective: Vital Signs: BP 114/73 (BP Location: Left Arm, Patient Position: Sitting, Cuff Size: Large)   Pulse  70   Resp 14   Ht 5' 2.5" (1.588 m)   Wt 193 lb 9.6 oz (87.8 kg)   BMI 34.85 kg/m    Physical Exam Vitals signs and nursing note reviewed.  Constitutional:      Appearance: She is well-developed.  HENT:     Head: Normocephalic and atraumatic.  Eyes:     Conjunctiva/sclera: Conjunctivae normal.  Neck:     Musculoskeletal: Normal range of motion.  Cardiovascular:     Rate and Rhythm: Normal rate and regular rhythm.     Heart sounds: Normal heart sounds.  Pulmonary:     Effort: Pulmonary effort is normal.     Breath sounds: Normal breath sounds.  Abdominal:     General: Bowel sounds are normal.     Palpations: Abdomen is soft.  Lymphadenopathy:     Cervical: No cervical adenopathy.  Skin:    General: Skin is warm and dry.     Capillary Refill: Capillary refill takes less than 2 seconds.  Neurological:     Mental Status: She is alert and oriented to person, place, and time.  Psychiatric:        Behavior: Behavior normal.      Musculoskeletal Exam: C-spine, thoracic spine, and lumbar spine good ROM.  No midline spinal tenderness.  No SI joint tenderness.  Shoulder joints, elbow joints, wrist joints, MCPs, PIPs, and DIPs good ROM with no synovitis.  Complete fist formation bilaterally.  Hip joints, knee joints, ankle joints, MTPs, PIPs, and DIPs good ROM with no synovitis.  No warmth or effusion of knee joints.  No tenderness or swelling of ankle joints.    CDAI Exam: CDAI Score: - Patient Global: -; Provider Global: - Swollen: -; Tender: - Joint Exam   No joint exam has been documented for this visit   There is currently no information documented on the homunculus. Go to the Rheumatology activity and complete the homunculus joint exam.  Investigation: No additional findings.  Imaging: No results found.  Recent Labs: Lab Results  Component Value Date   WBC 8.4 08/21/2018   HGB 12.7 08/21/2018   PLT 231 08/21/2018   NA 138 08/21/2018   K 3.8 08/21/2018   CL  103 08/21/2018   CO2 25 08/21/2018   GLUCOSE 87 08/21/2018   BUN 13 08/21/2018   CREATININE 0.84 08/21/2018   BILITOT 0.2 08/21/2018   ALKPHOS 55 09/25/2014   AST 12 08/21/2018   ALT 11 08/21/2018   PROT 7.1 08/21/2018   ALBUMIN 3.5 09/25/2014   CALCIUM 9.4 08/21/2018   GFRAA 101 08/21/2018    Speciality Comments: No specialty comments available.  Procedures:  No procedures performed Allergies: Pineapple and Biaxin [clarithromycin]   Assessment / Plan:     Visit Diagnoses: Positive ANA (antinuclear antibody) - ANA 1:320 H, 1:80 Speckled, ENA negative, all other autoimmune labs negative: She has no clinical features of autoimmune disease at this time.  She has been experiencing increased myalgias and joint stiffness.  She has no muscle weakness or synovitis on exam today.  We  will obtain the following lab work today. She was advised to notify us if she develops any new or worsening symptoms. - Plan: ANA, Urinalysis, Routine w reflex microscopic, COMPLETE METABOLIC PANEL WITH GFR, CBC with Differential/Platelet, C3 and C4, Anti-DNA antibody, double-stranded, Sedimentation rate, VITAMIN D 25 Hydroxy (Vit-D Deficiency, Fractures), CK  Elevated CK -She has persistent myalgias and intermittent fatigue.  She has full strength of upper and lower extremities.  She has no difficulty getting up from a seated position or raising her arms above her head.  Sed rate and CK were obtained today. She was advised to notify us if she develops increased muscle tenderness or muscle weakness.  Plan: Sedimentation rate, CK  Rash - Bx 06/17/18: sparse perivascular lymphocytic infiltrate w/ melanocytes in papillary dermis-consistent w/ inflammatory or post inflammatory pigmentary alteration-She has not had any recurrence of the rash.  She is followed by a dermatologist.  She is no longer using hydrocortisone cream.  She plans on following up with dermatology if she has a recurrence of the rash for a repeat biopsy. -  Plan: ANA, Urinalysis, Routine w reflex microscopic, COMPLETE METABOLIC PANEL WITH GFR, CBC with Differential/Platelet, C3 and C4, Anti-DNA antibody, double-stranded, Sedimentation rate, CK  Chondromalacia of right patella: She has chronic right knee joint pain.  She has no warmth or effusion on exam.  X-rays of the right knee were obtained on 09/16/18 and revealed moderate chondromalacia patella.  She has a handout of knee joint exercises, which she performs.   Vitamin D deficiency -Vitamin D level will be checked today. Plan: VITAMIN D 25 Hydroxy (Vit-D Deficiency, Fractures)  History of carpal tunnel syndrome: She is asymptomatic at this time.   Family history of systemic lupus erythematosus - +cousin, Mother has family history of Raynaud's.   Chronic left shoulder pain: X-rays on 08/21/18 were unremarkable. She has intermittent left shoulder joint pain.  She has good ROM with no discomfort on exam today.  She declined a referral to physical therapy.  She was given a handout of shoulder joint exercises to perform.  She is asymptomatic at this time, and declined a cortisone injection.  She will notify us if her discomfort persists or worsens.    Orders: Orders Placed This Encounter  Procedures  . ANA  . Urinalysis, Routine w reflex microscopic  . COMPLETE METABOLIC PANEL WITH GFR  . CBC with Differential/Platelet  . C3 and C4  . Anti-DNA antibody, double-stranded  . Sedimentation rate  . VITAMIN D 25 Hydroxy (Vit-D Deficiency, Fractures)  . CK   No orders of the defined types were placed in this encounter.   Face-to-face time spent with patient was 30 minutes. Greater than 50% of time was spent in counseling and coordination of care.  Follow-Up Instructions: Return in about 6 months (around 11/01/2019) for Positive ANA, elevated CK .   Gearldine Bienenstockaylor M Jerek Meulemans, PA-C   I examined and evaluated the patient with Sherron Alesaylor Charlayne Vultaggio PA.  Patient continues to experience some fatigue and arthralgia.  I did  not notice any synovitis on exam.  We will obtain some additional labs today.  The plan of care was discussed as noted above.  Pollyann SavoyShaili Deveshwar, MD  Note - This record has been created using Animal [employer]istDragon software.  Chart creation errors have been sought, but may not always  have been located. Such creation errors do not reflect on  the standard of medical care.

## 2019-05-03 ENCOUNTER — Ambulatory Visit: Payer: 59 | Admitting: Rheumatology

## 2019-05-03 ENCOUNTER — Other Ambulatory Visit: Payer: Self-pay

## 2019-05-03 ENCOUNTER — Encounter: Payer: Self-pay | Admitting: Rheumatology

## 2019-05-03 VITALS — BP 114/73 | HR 70 | Resp 14 | Ht 62.5 in | Wt 193.6 lb

## 2019-05-03 DIAGNOSIS — Z8669 Personal history of other diseases of the nervous system and sense organs: Secondary | ICD-10-CM

## 2019-05-03 DIAGNOSIS — R748 Abnormal levels of other serum enzymes: Secondary | ICD-10-CM | POA: Diagnosis not present

## 2019-05-03 DIAGNOSIS — M2241 Chondromalacia patellae, right knee: Secondary | ICD-10-CM | POA: Diagnosis not present

## 2019-05-03 DIAGNOSIS — R768 Other specified abnormal immunological findings in serum: Secondary | ICD-10-CM

## 2019-05-03 DIAGNOSIS — E559 Vitamin D deficiency, unspecified: Secondary | ICD-10-CM

## 2019-05-03 DIAGNOSIS — R21 Rash and other nonspecific skin eruption: Secondary | ICD-10-CM | POA: Diagnosis not present

## 2019-05-03 DIAGNOSIS — G8929 Other chronic pain: Secondary | ICD-10-CM

## 2019-05-03 DIAGNOSIS — M25512 Pain in left shoulder: Secondary | ICD-10-CM

## 2019-05-03 DIAGNOSIS — Z8269 Family history of other diseases of the musculoskeletal system and connective tissue: Secondary | ICD-10-CM

## 2019-05-03 NOTE — Patient Instructions (Signed)
Shoulder Exercises Ask your health care provider which exercises are safe for you. Do exercises exactly as told by your health care provider and adjust them as directed. It is normal to feel mild stretching, pulling, tightness, or discomfort as you do these exercises. Stop right away if you feel sudden pain or your pain gets worse. Do not begin these exercises until told by your health care provider. Stretching exercises External rotation and abduction This exercise is sometimes called corner stretch. This exercise rotates your arm outward (external rotation) and moves your arm out from your body (abduction). 1. Stand in a doorway with one of your feet slightly in front of the other. This is called a staggered stance. If you cannot reach your forearms to the door frame, stand facing a corner of a room. 2. Choose one of the following positions as told by your health care provider: ? Place your hands and forearms on the door frame above your head. ? Place your hands and forearms on the door frame at the height of your head. ? Place your hands on the door frame at the height of your elbows. 3. Slowly move your weight onto your front foot until you feel a stretch across your chest and in the front of your shoulders. Keep your head and chest upright and keep your abdominal muscles tight. 4. Hold for __________ seconds. 5. To release the stretch, shift your weight to your back foot. Repeat __________ times. Complete this exercise __________ times a day. Extension, standing 1. Stand and hold a broomstick, a cane, or a similar object behind your back. ? Your hands should be a little wider than shoulder width apart. ? Your palms should face away from your back. 2. Keeping your elbows straight and your shoulder muscles relaxed, move the stick away from your body until you feel a stretch in your shoulders (extension). ? Avoid shrugging your shoulders while you move the stick. Keep your shoulder blades tucked  down toward the middle of your back. 3. Hold for __________ seconds. 4. Slowly return to the starting position. Repeat __________ times. Complete this exercise __________ times a day. Range-of-motion exercises Pendulum  1. Stand near a wall or a surface that you can hold onto for balance. 2. Bend at the waist and let your left / right arm hang straight down. Use your other arm to support you. Keep your back straight and do not lock your knees. 3. Relax your left / right arm and shoulder muscles, and move your hips and your trunk so your left / right arm swings freely. Your arm should swing because of the motion of your body, not because you are using your arm or shoulder muscles. 4. Keep moving your hips and trunk so your arm swings in the following directions, as told by your health care provider: ? Side to side. ? Forward and backward. ? In clockwise and counterclockwise circles. 5. Continue each motion for __________ seconds, or for as long as told by your health care provider. 6. Slowly return to the starting position. Repeat __________ times. Complete this exercise __________ times a day. Shoulder flexion, standing  1. Stand and hold a broomstick, a cane, or a similar object. Place your hands a little more than shoulder width apart on the object. Your left / right hand should be palm up, and your other hand should be palm down. 2. Keep your elbow straight and your shoulder muscles relaxed. Push the stick up with your healthy arm to   raise your left / right arm in front of your body, and then over your head until you feel a stretch in your shoulder (flexion). ? Avoid shrugging your shoulder while you raise your arm. Keep your shoulder blade tucked down toward the middle of your back. 3. Hold for __________ seconds. 4. Slowly return to the starting position. Repeat __________ times. Complete this exercise __________ times a day. Shoulder abduction, standing 1. Stand and hold a broomstick,  a cane, or a similar object. Place your hands a little more than shoulder width apart on the object. Your left / right hand should be palm up, and your other hand should be palm down. 2. Keep your elbow straight and your shoulder muscles relaxed. Push the object across your body toward your left / right side. Raise your left / right arm to the side of your body (abduction) until you feel a stretch in your shoulder. ? Do not raise your arm above shoulder height unless your health care provider tells you to do that. ? If directed, raise your arm over your head. ? Avoid shrugging your shoulder while you raise your arm. Keep your shoulder blade tucked down toward the middle of your back. 3. Hold for __________ seconds. 4. Slowly return to the starting position. Repeat __________ times. Complete this exercise __________ times a day. Internal rotation  1. Place your left / right hand behind your back, palm up. 2. Use your other hand to dangle an exercise band, a towel, or a similar object over your shoulder. Grasp the band with your left / right hand so you are holding on to both ends. 3. Gently pull up on the band until you feel a stretch in the front of your left / right shoulder. The movement of your arm toward the center of your body is called internal rotation. ? Avoid shrugging your shoulder while you raise your arm. Keep your shoulder blade tucked down toward the middle of your back. 4. Hold for __________ seconds. 5. Release the stretch by letting go of the band and lowering your hands. Repeat __________ times. Complete this exercise __________ times a day. Strengthening exercises External rotation  1. Sit in a stable chair without armrests. 2. Secure an exercise band to a stable object at elbow height on your left / right side. 3. Place a soft object, such as a folded towel or a small pillow, between your left / right upper arm and your body to move your elbow about 4 inches (10 cm) away  from your side. 4. Hold the end of the exercise band so it is tight and there is no slack. 5. Keeping your elbow pressed against the soft object, slowly move your forearm out, away from your abdomen (external rotation). Keep your body steady so only your forearm moves. 6. Hold for __________ seconds. 7. Slowly return to the starting position. Repeat __________ times. Complete this exercise __________ times a day. Shoulder abduction  1. Sit in a stable chair without armrests, or stand up. 2. Hold a __________ weight in your left / right hand, or hold an exercise band with both hands. 3. Start with your arms straight down and your left / right palm facing in, toward your body. 4. Slowly lift your left / right hand out to your side (abduction). Do not lift your hand above shoulder height unless your health care provider tells you that this is safe. ? Keep your arms straight. ? Avoid shrugging your shoulder while you   do this movement. Keep your shoulder blade tucked down toward the middle of your back. 5. Hold for __________ seconds. 6. Slowly lower your arm, and return to the starting position. Repeat __________ times. Complete this exercise __________ times a day. Shoulder extension 1. Sit in a stable chair without armrests, or stand up. 2. Secure an exercise band to a stable object in front of you so it is at shoulder height. 3. Hold one end of the exercise band in each hand. Your palms should face each other. 4. Straighten your elbows and lift your hands up to shoulder height. 5. Step back, away from the secured end of the exercise band, until the band is tight and there is no slack. 6. Squeeze your shoulder blades together as you pull your hands down to the sides of your thighs (extension). Stop when your hands are straight down by your sides. Do not let your hands go behind your body. 7. Hold for __________ seconds. 8. Slowly return to the starting position. Repeat __________ times.  Complete this exercise __________ times a day. Shoulder row 1. Sit in a stable chair without armrests, or stand up. 2. Secure an exercise band to a stable object in front of you so it is at waist height. 3. Hold one end of the exercise band in each hand. Position your palms so that your thumbs are facing the ceiling (neutral position). 4. Bend each of your elbows to a 90-degree angle (right angle) and keep your upper arms at your sides. 5. Step back until the band is tight and there is no slack. 6. Slowly pull your elbows back behind you. 7. Hold for __________ seconds. 8. Slowly return to the starting position. Repeat __________ times. Complete this exercise __________ times a day. Shoulder press-ups  1. Sit in a stable chair that has armrests. Sit upright, with your feet flat on the floor. 2. Put your hands on the armrests so your elbows are bent and your fingers are pointing forward. Your hands should be about even with the sides of your body. 3. Push down on the armrests and use your arms to lift yourself off the chair. Straighten your elbows and lift yourself up as much as you comfortably can. ? Move your shoulder blades down, and avoid letting your shoulders move up toward your ears. ? Keep your feet on the ground. As you get stronger, your feet should support less of your body weight as you lift yourself up. 4. Hold for __________ seconds. 5. Slowly lower yourself back into the chair. Repeat __________ times. Complete this exercise __________ times a day. Wall push-ups  1. Stand so you are facing a stable wall. Your feet should be about one arm-length away from the wall. 2. Lean forward and place your palms on the wall at shoulder height. 3. Keep your feet flat on the floor as you bend your elbows and lean forward toward the wall. 4. Hold for __________ seconds. 5. Straighten your elbows to push yourself back to the starting position. Repeat __________ times. Complete this exercise  __________ times a day. This information is not intended to replace advice given to you by your health care provider. Make sure you discuss any questions you have with your health care provider. Document Released: 05/15/2005 Document Revised: 10/23/2018 Document Reviewed: 07/31/2018 Elsevier Patient Education  2020 [employer]  

## 2019-05-05 LAB — CBC WITH DIFFERENTIAL/PLATELET
Absolute Monocytes: 480 cells/uL (ref 200–950)
Basophils Absolute: 53 cells/uL (ref 0–200)
Basophils Relative: 0.7 %
Eosinophils Absolute: 308 cells/uL (ref 15–500)
Eosinophils Relative: 4.1 %
HCT: 40.6 % (ref 35.0–45.0)
Hemoglobin: 13.3 g/dL (ref 11.7–15.5)
Lymphs Abs: 3045 cells/uL (ref 850–3900)
MCH: 27.1 pg (ref 27.0–33.0)
MCHC: 32.8 g/dL (ref 32.0–36.0)
MCV: 82.7 fL (ref 80.0–100.0)
MPV: 12.4 fL (ref 7.5–12.5)
Monocytes Relative: 6.4 %
Neutro Abs: 3615 cells/uL (ref 1500–7800)
Neutrophils Relative %: 48.2 %
Platelets: 211 10*3/uL (ref 140–400)
RBC: 4.91 10*6/uL (ref 3.80–5.10)
RDW: 12.5 % (ref 11.0–15.0)
Total Lymphocyte: 40.6 %
WBC: 7.5 10*3/uL (ref 3.8–10.8)

## 2019-05-05 LAB — ANTI-NUCLEAR AB-TITER (ANA TITER): ANA Titer 1: 1:320 {titer} — ABNORMAL HIGH

## 2019-05-05 LAB — URINALYSIS, ROUTINE W REFLEX MICROSCOPIC
Bacteria, UA: NONE SEEN /HPF
Bilirubin Urine: NEGATIVE
Glucose, UA: NEGATIVE
Hyaline Cast: NONE SEEN /LPF
Ketones, ur: NEGATIVE
Leukocytes,Ua: NEGATIVE
Nitrite: NEGATIVE
Protein, ur: NEGATIVE
RBC / HPF: NONE SEEN /HPF (ref 0–2)
Specific Gravity, Urine: 1.032 (ref 1.001–1.03)
Squamous Epithelial / HPF: NONE SEEN /HPF (ref <=5)
WBC, UA: NONE SEEN /HPF (ref 0–5)
pH: <=5 (ref 5.0–8.0)

## 2019-05-05 LAB — COMPLETE METABOLIC PANEL WITH GFR
AG Ratio: 1.4 (calc) (ref 1.0–2.5)
ALT: 10 U/L (ref 6–29)
AST: 11 U/L (ref 10–30)
Albumin: 4 g/dL (ref 3.6–5.1)
Alkaline phosphatase (APISO): 69 U/L (ref 31–125)
BUN: 13 mg/dL (ref 7–25)
CO2: 26 mmol/L (ref 20–32)
Calcium: 9.2 mg/dL (ref 8.6–10.2)
Chloride: 106 mmol/L (ref 98–110)
Creat: 0.72 mg/dL (ref 0.50–1.10)
GFR, Est African American: 121 mL/min/{1.73_m2} (ref >=60)
GFR, Est Non African American: 105 mL/min/{1.73_m2} (ref >=60)
Globulin: 2.9 g/dL (calc) (ref 1.9–3.7)
Glucose, Bld: 91 mg/dL (ref 65–99)
Potassium: 4.2 mmol/L (ref 3.5–5.3)
Sodium: 141 mmol/L (ref 135–146)
Total Bilirubin: 0.2 mg/dL (ref 0.2–1.2)
Total Protein: 6.9 g/dL (ref 6.1–8.1)

## 2019-05-05 LAB — ANTI-DNA ANTIBODY, DOUBLE-STRANDED: ds DNA Ab: <1 IU/mL

## 2019-05-05 LAB — C3 AND C4
C3 Complement: 149 mg/dL (ref 83–193)
C4 Complement: 45 mg/dL (ref 15–57)

## 2019-05-05 LAB — CK: Total CK: 154 U/L — ABNORMAL HIGH (ref 29–143)

## 2019-05-05 LAB — VITAMIN D 25 HYDROXY (VIT D DEFICIENCY, FRACTURES): Vit D, 25-Hydroxy: 30 ng/mL (ref 30–100)

## 2019-05-05 LAB — ANA: Anti Nuclear Antibody (ANA): POSITIVE — AB

## 2019-05-05 LAB — SEDIMENTATION RATE: Sed Rate: 22 mm/h — ABNORMAL HIGH (ref 0–20)

## 2019-05-05 NOTE — Progress Notes (Signed)
ANA 1:320 nuclear, dense fine speckled-stable.  CK is elevated but stable.  Vitamin D is WNL-please recommend a maintenance dose of vitamin D.  CBC and CMP WNL.  Sed rate borderline elevated but stable.   DsDNA negative.  Complements WNL. UA revealed trace hgb.  Please notify patient.  Was she on her menstrual cycle?

## 2019-05-20 ENCOUNTER — Encounter: Payer: Self-pay | Admitting: *Deleted

## 2019-08-23 ENCOUNTER — Telehealth: Payer: Self-pay | Admitting: Rheumatology

## 2019-08-23 NOTE — Telephone Encounter (Signed)
Opened in error

## 2019-08-24 NOTE — Progress Notes (Deleted)
Office Visit Note  Patient: [name]             Date of Birth: Jan 27, 1981           MRN: [id number]             PCP: Juluis Rainier, MD Referring: Juluis Rainier, MD Visit Date: 08/26/2019 Occupation: @GUAROCC @  Subjective:  No chief complaint on file.   History of Present Illness: [name] is a 39 y.o. female ***   Activities of Daily Living:  Patient reports morning stiffness for *** {minute/hour:19697}.   Patient {ACTIONS;DENIES/REPORTS:21021675::"Denies"} nocturnal pain.  Difficulty dressing/grooming: {ACTIONS;DENIES/REPORTS:21021675::"Denies"} Difficulty climbing stairs: {ACTIONS;DENIES/REPORTS:21021675::"Denies"} Difficulty getting out of chair: {ACTIONS;DENIES/REPORTS:21021675::"Denies"} Difficulty using hands for taps, buttons, cutlery, and/or writing: {ACTIONS;DENIES/REPORTS:21021675::"Denies"}  No Rheumatology ROS completed.   PMFS History:  Patient Active Problem List   Diagnosis Date Noted  . Family history of systemic lupus erythematosus 09/16/2018  . Hx of migraines 08/21/2018  . Prediabetes 08/21/2018  . Vitamin D deficiency 08/21/2018  . History of carpal tunnel syndrome 08/21/2018    Past Medical History:  Diagnosis Date  . Healthy adult   . Migraine     Family History  Problem Relation Age of Onset  . Hypertension Mother   . Diabetes Mother   . Asthma Mother   . Asthma Father   . Heart attack Father   . Hypertension Brother   . Heart attack Brother   . Asthma Brother   . Healthy Son   . Healthy Daughter    Past Surgical History:  Procedure Laterality Date  . ABLATION    . CHOLECYSTECTOMY    . TONSILLECTOMY    . TUBAL LIGATION     Social History   Social History Narrative   Lives with two children   Drinks caffeine every once in a while     There is no immunization history on file for this patient.   Objective: Vital Signs: There were no vitals taken for this visit.   Physical Exam   Musculoskeletal Exam:  ***  CDAI Exam: CDAI Score: - Patient Global: -; Provider Global: - Swollen: -; Tender: - Joint Exam 08/26/2019   No joint exam has been documented for this visit   There is currently no information documented on the homunculus. Go to the Rheumatology activity and complete the homunculus joint exam.  Investigation: No additional findings.  Imaging: No results found.  Recent Labs: Lab Results  Component Value Date   WBC 7.5 05/03/2019   HGB 13.3 05/03/2019   PLT 211 05/03/2019   NA 141 05/03/2019   K 4.2 05/03/2019   CL 106 05/03/2019   CO2 26 05/03/2019   GLUCOSE 91 05/03/2019   BUN 13 05/03/2019   CREATININE 0.72 05/03/2019   BILITOT 0.2 05/03/2019   ALKPHOS 55 09/25/2014   AST 11 05/03/2019   ALT 10 05/03/2019   PROT 6.9 05/03/2019   ALBUMIN 3.5 09/25/2014   CALCIUM 9.2 05/03/2019   GFRAA 121 05/03/2019    Speciality Comments: No specialty comments available.  Procedures:  No procedures performed Allergies: Pineapple and Biaxin [clarithromycin]   Assessment / Plan:     Visit Diagnoses: No diagnosis found.  Orders: No orders of the defined types were placed in this encounter.  No orders of the defined types were placed in this encounter.   Face-to-face time spent with patient was *** minutes. Greater than 50% of time was spent in counseling and coordination of care.  Follow-Up Instructions: No follow-ups on  file.   Ofilia Neas, PA-C  Note - This record has been created using Dragon software.  Chart creation errors have been sought, but may not always  have been located. Such creation errors do not reflect on  the standard of medical care.

## 2019-08-26 ENCOUNTER — Ambulatory Visit: Payer: 59 | Admitting: Rheumatology

## 2019-10-22 NOTE — Progress Notes (Signed)
 Office Visit Note  Patient: [name]             Date of Birth: 11/21/1980           MRN: [id number]             PCP: Barnes, Elizabeth, MD Referring: Barnes, Elizabeth, MD Visit Date: 11/01/2019 Occupation: @GUAROCC@  Subjective:  Paresthesia in both hands   History of Present Illness: Elanna Natal is a 39 y.o. female with history of positive ANA.  She denies any new or worsening symptoms.  She denies any recent rashes, photosensitivity, hair loss, photosensitivity, enlarged lymph nodes, symptoms of Raynaud's, oral or nasal ulcerations, or mouth dryness.  She has chronic eye dryness which has been tolerable with refresh eye drops.  She denies any joint pain or joint swelling at this time.  She continues to have symptoms of carpal tunnel syndrome bilaterally.  She experiences intermittent discomfort in the right knee but denies any joint swelling.  She denies any muscle weakness or muscle tenderness.    Activities of Daily Living:  Patient reports morning stiffness for 10-15 minutes.   Patient Denies nocturnal pain.  Difficulty dressing/grooming: Denies Difficulty climbing stairs: Denies Difficulty getting out of chair: Denies Difficulty using hands for taps, buttons, cutlery, and/or writing: Denies  Review of Systems  Constitutional: Negative for fatigue.  HENT: Negative for mouth sores, mouth dryness and nose dryness.   Eyes: Positive for dryness. Negative for pain, itching and visual disturbance.  Respiratory: Negative for cough, hemoptysis, shortness of breath and difficulty breathing.   Cardiovascular: Negative for chest pain, palpitations, hypertension and swelling in legs/feet.  Gastrointestinal: Negative for blood in stool, constipation and diarrhea.  Endocrine: Negative for increased urination.  Genitourinary: Negative for difficulty urinating and painful urination.  Musculoskeletal: Positive for arthralgias, joint pain and morning stiffness. Negative for joint  swelling, myalgias, muscle weakness, muscle tenderness and myalgias.  Skin: Negative for color change, pallor, rash, hair loss, nodules/bumps, redness, skin tightness, ulcers and sensitivity to sunlight.  Allergic/Immunologic: Negative for susceptible to infections.  Neurological: Positive for numbness. Negative for dizziness, loss of consciousness, headaches, memory loss and weakness.  Hematological: Negative for bruising/bleeding tendency and swollen glands.  Psychiatric/Behavioral: Negative for depressed mood, confusion and sleep disturbance. The patient is not nervous/anxious.     PMFS History:  Patient Active Problem List   Diagnosis Date Noted  . Family history of systemic lupus erythematosus 09/16/2018  . Hx of migraines 08/21/2018  . Prediabetes 08/21/2018  . Vitamin D deficiency 08/21/2018  . History of carpal tunnel syndrome 08/21/2018    Past Medical History:  Diagnosis Date  . Healthy adult   . Migraine     Family History  Problem Relation Age of Onset  . Hypertension Mother   . Diabetes Mother   . Asthma Mother   . Asthma Father   . Heart attack Father   . Hypertension Brother   . Heart attack Brother   . Asthma Brother   . Healthy Son   . Healthy Daughter    Past Surgical History:  Procedure Laterality Date  . ABLATION    . CHOLECYSTECTOMY    . TONSILLECTOMY    . TUBAL LIGATION     Social History   Social History Narrative   Lives with two children   Drinks caffeine every once in a while     There is no immunization history on file for this patient.   Objective: Vital Signs: BP 116/77 (BP Location:   Left Arm, Patient Position: Sitting, Cuff Size: Normal)   Pulse 78   Resp 15   Ht 5' 2.5" (1.588 m)   Wt 196 lb 3.2 oz (89 kg)   BMI 35.31 kg/m    Physical Exam Vitals and nursing note reviewed.  Constitutional:      Appearance: She is well-developed.  HENT:     Head: Normocephalic and atraumatic.  Eyes:     Conjunctiva/sclera: Conjunctivae  normal.  Pulmonary:     Effort: Pulmonary effort is normal.  Abdominal:     General: Bowel sounds are normal.     Palpations: Abdomen is soft.  Musculoskeletal:     Cervical back: Normal range of motion.  Lymphadenopathy:     Cervical: No cervical adenopathy.  Skin:    General: Skin is warm and dry.     Capillary Refill: Capillary refill takes less than 2 seconds.  Neurological:     Mental Status: She is alert and oriented to person, place, and time.  Psychiatric:        Behavior: Behavior normal.      Musculoskeletal Exam: C-spine, thoracic spine, lumbar spine good range of motion.  No midline spinal tenderness.  Shoulder joints, elbow joints, wrist joints, MCPs, PIPs and DIPs good range of motion with no synovitis.  She has complete fist formation bilaterally.  Hip joints, knee joints and ankle joints MTPs, PIPs and DIPs good range of motion with no synovitis.  No warmth or effusion of bilateral knee joints.  No tenderness or swelling of ankle joints.  CDAI Exam: CDAI Score: -- Patient Global: --; Provider Global: -- Swollen: --; Tender: -- Joint Exam 11/01/2019   No joint exam has been documented for this visit   There is currently no information documented on the homunculus. Go to the Rheumatology activity and complete the homunculus joint exam.  Investigation: No additional findings.  Imaging: No results found.  Recent Labs: Lab Results  Component Value Date   WBC 7.5 05/03/2019   HGB 13.3 05/03/2019   PLT 211 05/03/2019   NA 141 05/03/2019   K 4.2 05/03/2019   CL 106 05/03/2019   CO2 26 05/03/2019   GLUCOSE 91 05/03/2019   BUN 13 05/03/2019   CREATININE 0.72 05/03/2019   BILITOT 0.2 05/03/2019   ALKPHOS 55 09/25/2014   AST 11 05/03/2019   ALT 10 05/03/2019   PROT 6.9 05/03/2019   ALBUMIN 3.5 09/25/2014   CALCIUM 9.2 05/03/2019   GFRAA 121 05/03/2019    Speciality Comments: No specialty comments available.  Procedures:  No procedures  performed Allergies: Pineapple and Biaxin [clarithromycin]   Assessment / Plan:     Visit Diagnoses: Positive ANA (antinuclear antibody) - ANA 1:320 H, 1:80 Speckled, ENA negative, all other autoimmune labs negative: She has no clinical features of autoimmune disease at this time.  She is not taking any immunosuppressive agents at this time.  Lab work from 05/03/2019 was reviewed with the patient today in the office and all questions were addressed.  She has not had any recent rashes, photosensitivity, hair loss, symptoms of Raynaud's, enlarged lymph nodes, oral or nasal ulcerations, mouth dryness, increased joint pain or joint swelling.  We will check autoimmune lab work today.  She was advised to notify us if she develops any new or worsening symptoms.  She will follow-up in the office in 6 months.- Plan: ANA, Urinalysis, Routine w reflex microscopic, Sedimentation rate, C3 and C4, Anti-DNA antibody, double-stranded, CBC with Differential/Platelet, COMPLETE METABOLIC PANEL  WITH GFR  Elevated CK -She is not experiencing any increased muscle weakness or muscle tenderness at this time.  She has full strength of upper and lower extremities.  CK was 154 on 05/03/2019.  We will recheck CK and sed rate today.  Plan: CK  Rash - Bx 06/17/18: sparse perivascular lymphocytic infiltrate w/ melanocytes in papillary dermis-consistent w/ inflammatory or post inflammatory pigmentary alteration-She has hyperpigmentation noted surrounding the site of her previous biopsy on the dorsal aspect of the left hand.  She has not developed any new or worsening rashes recently.  She was encouraged to wear sunscreen on a daily basis and avoid direct sun exposure due to her history of photosensitivity.  She will follow-up with her dermatologist if notices a recurrence of the rash.  Chondromalacia of right patella: She has good range of motion with no discomfort.  No warmth or effusion noted.  She experiences occasional discomfort when  climbing up steps.  Vitamin D deficiency: She is taking vitamin D 5000 units by mouth daily.  Her vitamin D level was 30 on 05/03/2019.  History of carpal tunnel syndrome: Bilateral-She experiences paresthesias in both hands intermittently.  According to the patient she has had nerve conduction studies in the past which revealed bilateral carpal tunnel syndrome.  She experiences daytime and nocturnal symptoms.  She was given a prescription for bilateral carpal tunnel wrist braces.  Elevated sed rate -History of elevated ESR and CK.  We will recheck ESR and CK today. Plan: Sedimentation rate, CK  Other medical conditions are listed as follows:  Prediabetes  Hx of migraines  Family history of systemic lupus erythematosus  Orders: Orders Placed This Encounter  Procedures  . ANA  . Urinalysis, Routine w reflex microscopic  . Sedimentation rate  . C3 and C4  . Anti-DNA antibody, double-stranded  . CBC with Differential/Platelet  . COMPLETE METABOLIC PANEL WITH GFR  . CK   No orders of the defined types were placed in this encounter.    Follow-Up Instructions: Return in about 6 months (around 05/02/2020) for Positive ANA.   Hazel Sams, PA-C  I examined and evaluated the patient with Hazel Sams PA. The plan of care was discussed as noted above.  Bo Merino, MD  Note - This record has been created using Editor, commissioning.  Chart creation errors have been sought, but may not always  have been located. Such creation errors do not reflect on  the standard of medical care.

## 2019-11-01 ENCOUNTER — Other Ambulatory Visit: Payer: Self-pay

## 2019-11-01 ENCOUNTER — Encounter: Payer: Self-pay | Admitting: Rheumatology

## 2019-11-01 ENCOUNTER — Ambulatory Visit: Payer: 59 | Admitting: Rheumatology

## 2019-11-01 VITALS — BP 116/77 | HR 78 | Resp 15 | Ht 62.5 in | Wt 196.2 lb

## 2019-11-01 DIAGNOSIS — R21 Rash and other nonspecific skin eruption: Secondary | ICD-10-CM

## 2019-11-01 DIAGNOSIS — M2241 Chondromalacia patellae, right knee: Secondary | ICD-10-CM | POA: Diagnosis not present

## 2019-11-01 DIAGNOSIS — R7303 Prediabetes: Secondary | ICD-10-CM

## 2019-11-01 DIAGNOSIS — R768 Other specified abnormal immunological findings in serum: Secondary | ICD-10-CM | POA: Diagnosis not present

## 2019-11-01 DIAGNOSIS — Z8669 Personal history of other diseases of the nervous system and sense organs: Secondary | ICD-10-CM

## 2019-11-01 DIAGNOSIS — E559 Vitamin D deficiency, unspecified: Secondary | ICD-10-CM

## 2019-11-01 DIAGNOSIS — R748 Abnormal levels of other serum enzymes: Secondary | ICD-10-CM | POA: Diagnosis not present

## 2019-11-01 DIAGNOSIS — Z8269 Family history of other diseases of the musculoskeletal system and connective tissue: Secondary | ICD-10-CM

## 2019-11-01 DIAGNOSIS — R700 Elevated erythrocyte sedimentation rate: Secondary | ICD-10-CM

## 2019-11-03 LAB — C3 AND C4
C3 Complement: 135 mg/dL (ref 83–193)
C4 Complement: 40 mg/dL (ref 15–57)

## 2019-11-03 LAB — ANTI-DNA ANTIBODY, DOUBLE-STRANDED: ds DNA Ab: <1 IU/mL

## 2019-11-03 LAB — CBC WITH DIFFERENTIAL/PLATELET
Absolute Monocytes: 360 cells/uL (ref 200–950)
Basophils Absolute: 50 cells/uL (ref 0–200)
Basophils Relative: 0.8 %
Eosinophils Absolute: 298 cells/uL (ref 15–500)
Eosinophils Relative: 4.8 %
HCT: 39.9 % (ref 35.0–45.0)
Hemoglobin: 13.1 g/dL (ref 11.7–15.5)
Lymphs Abs: 2375 cells/uL (ref 850–3900)
MCH: 27.8 pg (ref 27.0–33.0)
MCHC: 32.8 g/dL (ref 32.0–36.0)
MCV: 84.7 fL (ref 80.0–100.0)
MPV: 11.8 fL (ref 7.5–12.5)
Monocytes Relative: 5.8 %
Neutro Abs: 3119 cells/uL (ref 1500–7800)
Neutrophils Relative %: 50.3 %
Platelets: 225 10*3/uL (ref 140–400)
RBC: 4.71 10*6/uL (ref 3.80–5.10)
RDW: 12.5 % (ref 11.0–15.0)
Total Lymphocyte: 38.3 %
WBC: 6.2 10*3/uL (ref 3.8–10.8)

## 2019-11-03 LAB — COMPLETE METABOLIC PANEL WITH GFR
AG Ratio: 1.6 (calc) (ref 1.0–2.5)
ALT: 11 U/L (ref 6–29)
AST: 11 U/L (ref 10–30)
Albumin: 3.9 g/dL (ref 3.6–5.1)
Alkaline phosphatase (APISO): 60 U/L (ref 31–125)
BUN: 12 mg/dL (ref 7–25)
CO2: 28 mmol/L (ref 20–32)
Calcium: 9.6 mg/dL (ref 8.6–10.2)
Chloride: 106 mmol/L (ref 98–110)
Creat: 0.66 mg/dL (ref 0.50–1.10)
GFR, Est African American: 128 mL/min/{1.73_m2} (ref >=60)
GFR, Est Non African American: 110 mL/min/{1.73_m2} (ref >=60)
Globulin: 2.5 g/dL (calc) (ref 1.9–3.7)
Glucose, Bld: 100 mg/dL — ABNORMAL HIGH (ref 65–99)
Potassium: 4.5 mmol/L (ref 3.5–5.3)
Sodium: 141 mmol/L (ref 135–146)
Total Bilirubin: 0.2 mg/dL (ref 0.2–1.2)
Total Protein: 6.4 g/dL (ref 6.1–8.1)

## 2019-11-03 LAB — URINALYSIS, ROUTINE W REFLEX MICROSCOPIC
Bacteria, UA: NONE SEEN /HPF
Bilirubin Urine: NEGATIVE
Glucose, UA: NEGATIVE
Hyaline Cast: NONE SEEN /LPF
Ketones, ur: NEGATIVE
Leukocytes,Ua: NEGATIVE
Nitrite: NEGATIVE
Protein, ur: NEGATIVE
Specific Gravity, Urine: 1.028 (ref 1.001–1.03)
Squamous Epithelial / HPF: NONE SEEN /HPF (ref <=5)
WBC, UA: NONE SEEN /HPF (ref 0–5)
pH: 5.5 (ref 5.0–8.0)

## 2019-11-03 LAB — CK: Total CK: 212 U/L — ABNORMAL HIGH (ref 29–143)

## 2019-11-03 LAB — ANA: Anti Nuclear Antibody (ANA): POSITIVE — AB

## 2019-11-03 LAB — SEDIMENTATION RATE: Sed Rate: 17 mm/h (ref 0–20)

## 2019-11-03 LAB — ANTI-NUCLEAR AB-TITER (ANA TITER): ANA Titer 1: 1:320 {titer} — ABNORMAL HIGH

## 2019-11-03 NOTE — Progress Notes (Signed)
CK is elevated but overall stable.  Please add aldolase.   ANA remains positive with stable titer.  Complements WNL.  DsDNA negative.  ESR WNL.  CBC and CMP WNL.  UA trace hemoglobin.  Please ask if the patient was on her menstrual cycle.

## 2020-01-06 NOTE — Progress Notes (Signed)
Office Visit Note  Patient: [name]             Date of Birth: [birth year]           MRN: [id number]             PCP: Leighton Ruff, MD Referring: Leighton Ruff, MD Visit Date: 01/07/2020 Occupation: '@GUAROCC' @  Subjective:  Generalized pain.   History of Present Illness: [name] is a 39 y.o. female seen today in urgent basis due to increased pain and discomfort.  She states that about 5 days ago she started having pain all over her body which has been progressively getting worse.  She states difficult for her to do her job.  She had to leave her job in the middle of the night she works night shift.  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  She is also taking care of her mother at home.  Her dog is also sick.  He denies any joint swelling.  She states the pain is all over her body.  Activities of Daily Living:  Patient reports morning stiffness for 0  minutes.   Patient Reports nocturnal pain.  Difficulty dressing/grooming: Denies Difficulty climbing stairs: Reports Difficulty getting out of chair: Reports Difficulty using hands for taps, buttons, cutlery, and/or writing: Reports  Review of Systems  Constitutional: Negative for fatigue, night sweats, weight gain and weight loss.  HENT: Positive for mouth sores and mouth dryness. Negative for trouble swallowing, trouble swallowing and nose dryness.   Eyes: Positive for dryness. Negative for pain, redness, itching and visual disturbance.  Respiratory: Negative for cough, shortness of breath and difficulty breathing.   Cardiovascular: Negative for chest pain, palpitations, hypertension, irregular heartbeat and swelling in legs/feet.  Gastrointestinal: Negative for blood in stool, constipation and diarrhea.  Endocrine: Positive for excessive thirst. Negative for increased urination.  Genitourinary: Negative for difficulty urinating, painful urination and vaginal dryness.    Musculoskeletal: Positive for arthralgias, joint pain, myalgias, muscle tenderness and myalgias. Negative for joint swelling, muscle weakness and morning stiffness.  Skin: Positive for rash. Negative for color change, hair loss, skin tightness, ulcers and sensitivity to sunlight.  Allergic/Immunologic: Negative for susceptible to infections.  Neurological: Positive for weakness. Negative for dizziness, numbness, headaches, memory loss and night sweats.  Hematological: Negative for bruising/bleeding tendency and swollen glands.  Psychiatric/Behavioral: Positive for sleep disturbance. Negative for depressed mood and confusion. The patient is not nervous/anxious.     PMFS History:  Patient Active Problem List   Diagnosis Date Noted  . Family history of systemic lupus erythematosus 09/16/2018  . Hx of migraines 08/21/2018  . Prediabetes 08/21/2018  . Vitamin D deficiency 08/21/2018  . History of carpal tunnel syndrome 08/21/2018    Past Medical History:  Diagnosis Date  . Healthy adult   . Migraine     Family History  Problem Relation Age of Onset  . Hypertension Mother   . Diabetes Mother   . Asthma Mother   . Asthma Father   . Heart attack Father   . Hypertension Brother   . Heart attack Brother   . Asthma Brother   . Healthy Son   . Healthy Daughter    Past Surgical History:  Procedure Laterality Date  . ABLATION    . CHOLECYSTECTOMY    . TONSILLECTOMY    . TUBAL LIGATION     Social History   Social History Narrative   Lives with  two children   Drinks caffeine every once in a while     There is no immunization history on file for this patient.   Objective: Vital Signs: BP 134/87 (BP Location: Right Wrist, Patient Position: Sitting, Cuff Size: Normal)   Pulse 88   Resp 14   Ht 5' 2.5" (1.588 m)   Wt 195 lb 6.4 oz (88.6 kg)   BMI 35.17 kg/m    Physical Exam Vitals and nursing note reviewed.  Constitutional:      Appearance: She is well-developed.  HENT:      Head: Normocephalic and atraumatic.  Eyes:     Conjunctiva/sclera: Conjunctivae normal.  Cardiovascular:     Rate and Rhythm: Normal rate and regular rhythm.     Heart sounds: Normal heart sounds.  Pulmonary:     Effort: Pulmonary effort is normal.     Breath sounds: Normal breath sounds.  Abdominal:     General: Bowel sounds are normal.     Palpations: Abdomen is soft.  Musculoskeletal:     Cervical back: Normal range of motion.  Lymphadenopathy:     Cervical: No cervical adenopathy.  Skin:    General: Skin is warm and dry.     Capillary Refill: Capillary refill takes less than 2 seconds.  Neurological:     Mental Status: She is alert and oriented to person, place, and time.  Psychiatric:        Behavior: Behavior normal.      Musculoskeletal Exam: C-spine, thoracic and lumbar spine were in good range of motion.  Shoulder joints, elbow joints, wrist joints, MCPs PIPs and DIPs with good range of motion with no synovitis.  Hip joints, knee joints, ankles, MTPs and PIPs with good range of motion with no synovitis.  She has generalized hyperalgesia.  Muscle strength was difficult to assess due to generalized pain.  CDAI Exam: CDAI Score: -- Patient Global: --; Provider Global: -- Swollen: --; Tender: -- Joint Exam 01/07/2020   No joint exam has been documented for this visit   There is currently no information documented on the homunculus. Go to the Rheumatology activity and complete the homunculus joint exam.  Investigation: No additional findings.  Imaging: No results found.  Recent Labs: Lab Results  Component Value Date   WBC 6.2 11/01/2019   HGB 13.1 11/01/2019   PLT 225 11/01/2019   NA 141 11/01/2019   K 4.5 11/01/2019   CL 106 11/01/2019   CO2 28 11/01/2019   GLUCOSE 100 (H) 11/01/2019   BUN 12 11/01/2019   CREATININE 0.66 11/01/2019   BILITOT 0.2 11/01/2019   ALKPHOS 55 09/25/2014   AST 11 11/01/2019   ALT 11 11/01/2019   PROT 6.4 11/01/2019    ALBUMIN 3.5 09/25/2014   CALCIUM 9.6 11/01/2019   GFRAA 128 11/01/2019   August 16, 2018 anticardiolipin negative, beta-2 GP 1 -, lupus anticoagulant negative, pan-ANCA negative May 03, 2019 C3-C4 normal, dsDNA negative November 01, 2019 ESR normal, C3-C4 normal, anti-DNA negative, ANA 1: 320, CK 212, UA negative Speciality Comments: No specialty comments available.  Procedures:  No procedures performed Allergies: Pineapple, Tinidazole, and Biaxin [clarithromycin]   Assessment / Plan:     Visit Diagnoses: Positive ANA (antinuclear antibody) - ANA 1:320 H, 1:80 Speckled, ENA negative, all other autoimmune labs negative -patient had persistently positive ANA but no clinical features of autoimmune disease.  She has been negative experience increased pain and discomfort and ongoing rash I will obtain her ENA panel again  today.  Plan: CBC with Differential/Platelet, COMPLETE METABOLIC PANEL WITH GFR, ANA, Anti-scleroderma antibody, RNP Antibody, Anti-Smith antibody, Sjogrens syndrome-A extractable nuclear antibody, Sjogrens syndrome-B extractable nuclear antibody  Elevated CK -she had mildly elevated CK.  She presents with generalized myalgias and difficulty walking due to severe muscle pain.  I will obtain labs today.  Plan: CK, Sedimentation rate, Aldolase, Myositis Assessr Plus Jo-1 Autoabs.  Patient has missed a lot of work due to severe pain.  We will give her a work excuse until her follow-up visit which will be on Monday.  She was given a work excuse from last Sunday until the following Monday.  Rash - Bx 06/17/18: sparse perivascular lymphocytic infiltrate w/ melanocytes in papillary dermis-consistent w/ inflammatory or post inflammatory pigmentary alteration  Other fatigue -she has been experiencing increased fatigue.  Plan: TSH, Serum protein electrophoresis with reflex, IgG, IgA, IgM, VITAMIN D 25 Hydroxy (Vit-D Deficiency, Fractures)   Chondromalacia of right patella-she is not having  much discomfort in her knee.  Vitamin D deficiency-she has history of vitamin D deficiency.  We will check vitamin D level today.  Plan-  History of carpal tunnel syndrome - Bilateral-  Elevated sed rate - History of elevated ESR and CK.  Hx of migraines  Prediabetes  Family history of systemic lupus erythematosus   Orders: Orders Placed This Encounter  Procedures  . CBC with Differential/Platelet  . COMPLETE METABOLIC PANEL WITH GFR  . CK  . TSH  . Sedimentation rate  . Aldolase  . Myositis Assessr Plus Jo-1 Autoabs  . ANA  . Anti-scleroderma antibody  . RNP Antibody  . Anti-Smith antibody  . Sjogrens syndrome-A extractable nuclear antibody  . Sjogrens syndrome-B extractable nuclear antibody  . Serum protein electrophoresis with reflex  . IgG, IgA, IgM  . VITAMIN D 25 Hydroxy (Vit-D Deficiency, Fractures)   No orders of the defined types were placed in this encounter.     Follow-Up Instructions: Return in about 3 days (around 01/10/2020) for Myalgia.   Bo Merino, MD  Note - This record has been created using Editor, commissioning.  Chart creation errors have been sought, but may not always  have been located. Such creation errors do not reflect on  the standard of medical care.

## 2020-01-07 ENCOUNTER — Ambulatory Visit: Payer: 59 | Admitting: Rheumatology

## 2020-01-07 ENCOUNTER — Other Ambulatory Visit: Payer: Self-pay

## 2020-01-07 ENCOUNTER — Encounter: Payer: Self-pay | Admitting: Rheumatology

## 2020-01-07 VITALS — BP 134/87 | HR 88 | Resp 14 | Ht 62.5 in | Wt 195.4 lb

## 2020-01-07 DIAGNOSIS — R748 Abnormal levels of other serum enzymes: Secondary | ICD-10-CM

## 2020-01-07 DIAGNOSIS — R21 Rash and other nonspecific skin eruption: Secondary | ICD-10-CM | POA: Diagnosis not present

## 2020-01-07 DIAGNOSIS — Z8669 Personal history of other diseases of the nervous system and sense organs: Secondary | ICD-10-CM

## 2020-01-07 DIAGNOSIS — R768 Other specified abnormal immunological findings in serum: Secondary | ICD-10-CM

## 2020-01-07 DIAGNOSIS — E559 Vitamin D deficiency, unspecified: Secondary | ICD-10-CM

## 2020-01-07 DIAGNOSIS — R5383 Other fatigue: Secondary | ICD-10-CM

## 2020-01-07 DIAGNOSIS — R7303 Prediabetes: Secondary | ICD-10-CM

## 2020-01-07 DIAGNOSIS — M2241 Chondromalacia patellae, right knee: Secondary | ICD-10-CM

## 2020-01-07 DIAGNOSIS — R700 Elevated erythrocyte sedimentation rate: Secondary | ICD-10-CM

## 2020-01-07 DIAGNOSIS — Z8269 Family history of other diseases of the musculoskeletal system and connective tissue: Secondary | ICD-10-CM

## 2020-01-07 NOTE — Progress Notes (Deleted)
Office Visit Note  Patient: [name]             Date of Birth: 1981-05-02           MRN: [id number]             PCP: Juluis Rainier, MD Referring: Juluis Rainier, MD Visit Date: 01/10/2020 Occupation: @GUAROCC @  Subjective:  No chief complaint on file.   History of Present Illness: [name] is a 39 y.o. female ***   Activities of Daily Living:  Patient reports morning stiffness for *** {minute/hour:19697}.   Patient {ACTIONS;DENIES/REPORTS:21021675::"Denies"} nocturnal pain.  Difficulty dressing/grooming: {ACTIONS;DENIES/REPORTS:21021675::"Denies"} Difficulty climbing stairs: {ACTIONS;DENIES/REPORTS:21021675::"Denies"} Difficulty getting out of chair: {ACTIONS;DENIES/REPORTS:21021675::"Denies"} Difficulty using hands for taps, buttons, cutlery, and/or writing: {ACTIONS;DENIES/REPORTS:21021675::"Denies"}  No Rheumatology ROS completed.   PMFS History:  Patient Active Problem List   Diagnosis Date Noted  . Family history of systemic lupus erythematosus 09/16/2018  . Hx of migraines 08/21/2018  . Prediabetes 08/21/2018  . Vitamin D deficiency 08/21/2018  . History of carpal tunnel syndrome 08/21/2018    Past Medical History:  Diagnosis Date  . Healthy adult   . Migraine     Family History  Problem Relation Age of Onset  . Hypertension Mother   . Diabetes Mother   . Asthma Mother   . Asthma Father   . Heart attack Father   . Hypertension Brother   . Heart attack Brother   . Asthma Brother   . Healthy Son   . Healthy Daughter    Past Surgical History:  Procedure Laterality Date  . ABLATION    . CHOLECYSTECTOMY    . TONSILLECTOMY    . TUBAL LIGATION     Social History   Social History Narrative   Lives with two children   Drinks caffeine every once in a while     There is no immunization history on file for this patient.   Objective: Vital Signs: There were no vitals taken for this visit.   Physical Exam   Musculoskeletal Exam:  ***  CDAI Exam: CDAI Score: -- Patient Global: --; Provider Global: -- Swollen: --; Tender: -- Joint Exam 01/10/2020   No joint exam has been documented for this visit   There is currently no information documented on the homunculus. Go to the Rheumatology activity and complete the homunculus joint exam.  Investigation: No additional findings.  Imaging: No results found.  Recent Labs: Lab Results  Component Value Date   WBC 6.2 11/01/2019   HGB 13.1 11/01/2019   PLT 225 11/01/2019   NA 141 11/01/2019   K 4.5 11/01/2019   CL 106 11/01/2019   CO2 28 11/01/2019   GLUCOSE 100 (H) 11/01/2019   BUN 12 11/01/2019   CREATININE 0.66 11/01/2019   BILITOT 0.2 11/01/2019   ALKPHOS 55 09/25/2014   AST 11 11/01/2019   ALT 11 11/01/2019   PROT 6.4 11/01/2019   ALBUMIN 3.5 09/25/2014   CALCIUM 9.6 11/01/2019   GFRAA 128 11/01/2019    Speciality Comments: No specialty comments available.  Procedures:  No procedures performed Allergies: Pineapple, Tinidazole, and Biaxin [clarithromycin]   Assessment / Plan:     Visit Diagnoses: No diagnosis found.  Orders: No orders of the defined types were placed in this encounter.  No orders of the defined types were placed in this encounter.   Face-to-face time spent with patient was *** minutes. Greater than 50% of time was spent in counseling and coordination of care.  Follow-Up Instructions: No  follow-ups on file.   Ofilia Neas, PA-C  Note - This record has been created using Dragon software.  Chart creation errors have been sought, but may not always  have been located. Such creation errors do not reflect on  the standard of medical care.

## 2020-01-10 ENCOUNTER — Ambulatory Visit: Payer: 59 | Admitting: Physician Assistant

## 2020-01-10 ENCOUNTER — Other Ambulatory Visit: Payer: Self-pay

## 2020-01-10 NOTE — Progress Notes (Signed)
Office Visit Note  Patient: [name]             Date of Birth: March 06, 1981           MRN: [id number]             PCP: Leighton Ruff, MD Referring: Leighton Ruff, MD Visit Date: 01/12/2020 Occupation: '@GUAROCC' @  Subjective:  Generalized pain   History of Present Illness: [name] is a 39 y.o. female she was seen last week with generalized pain and fatigue.  She could not go back to work and gradually her symptoms have been improving.  She states she was having pain in all of her muscles and she noticed a rash on her arms.  The rash comes and goes.  She denies any history of frequent oral ulcers, nasal ulcers, malar rash, photosensitivity or Raynaud's phenomenon.  There is no history of lymphadenopathy.  She complains of pain in her muscles, bones in her joints.  Activities of Daily Living:  Patient reports morning stiffness for 3 minutes.   Patient Denies nocturnal pain.  Difficulty dressing/grooming: Denies Difficulty climbing stairs: Reports Difficulty getting out of chair: Reports Difficulty using hands for taps, buttons, cutlery, and/or writing: Reports  Review of Systems  Constitutional: Positive for fatigue. Negative for night sweats, weight gain and weight loss.  HENT: Negative for mouth sores, trouble swallowing, trouble swallowing, mouth dryness and nose dryness.   Eyes: Positive for dryness. Negative for pain, redness and visual disturbance.  Respiratory: Negative for cough, shortness of breath and difficulty breathing.   Cardiovascular: Negative for chest pain, palpitations, hypertension, irregular heartbeat and swelling in legs/feet.  Gastrointestinal: Negative for blood in stool, constipation and diarrhea.  Endocrine: Negative for excessive thirst and increased urination.  Genitourinary: Negative for difficulty urinating and vaginal dryness.  Musculoskeletal: Positive for arthralgias, joint pain, muscle weakness, morning stiffness and muscle  tenderness. Negative for joint swelling, myalgias and myalgias.  Skin: Positive for rash. Negative for color change, hair loss, skin tightness, ulcers and sensitivity to sunlight.  Allergic/Immunologic: Negative for susceptible to infections.  Neurological: Negative for dizziness, numbness, memory loss, night sweats and weakness.  Hematological: Negative for bruising/bleeding tendency and swollen glands.  Psychiatric/Behavioral: Negative for depressed mood and sleep disturbance. The patient is not nervous/anxious.     PMFS History:  Patient Active Problem List   Diagnosis Date Noted  . Family history of systemic lupus erythematosus 09/16/2018  . Hx of migraines 08/21/2018  . Prediabetes 08/21/2018  . Vitamin D deficiency 08/21/2018  . History of carpal tunnel syndrome 08/21/2018    Past Medical History:  Diagnosis Date  . Healthy adult   . Migraine     Family History  Problem Relation Age of Onset  . Hypertension Mother   . Diabetes Mother   . Asthma Mother   . Asthma Father   . Heart attack Father   . Hypertension Brother   . Heart attack Brother   . Asthma Brother   . Healthy Son   . Healthy Daughter    Past Surgical History:  Procedure Laterality Date  . ABLATION    . CHOLECYSTECTOMY    . TONSILLECTOMY    . TUBAL LIGATION     Social History   Social History Narrative   Lives with two children   Drinks caffeine every once in a while     There is no immunization history on file for this patient.   Objective: Vital Signs: BP 102/71 (BP Location: Left Arm,  Patient Position: Sitting, Cuff Size: Normal)   Pulse 96   Resp 14   Ht 5' 2.5" (1.588 m)   Wt 196 lb (88.9 kg)   BMI 35.28 kg/m    Physical Exam Vitals and nursing note reviewed.  Constitutional:      Appearance: She is well-developed.  HENT:     Head: Normocephalic and atraumatic.  Eyes:     Conjunctiva/sclera: Conjunctivae normal.  Cardiovascular:     Rate and Rhythm: Normal rate and regular  rhythm.     Heart sounds: Normal heart sounds.  Pulmonary:     Effort: Pulmonary effort is normal.     Breath sounds: Normal breath sounds.  Abdominal:     General: Bowel sounds are normal.     Palpations: Abdomen is soft.  Musculoskeletal:     Cervical back: Normal range of motion.  Lymphadenopathy:     Cervical: No cervical adenopathy.  Skin:    General: Skin is warm and dry.     Capillary Refill: Capillary refill takes less than 2 seconds.     Comments: Mild erythema is noted on her left arm.  Neurological:     Mental Status: She is alert and oriented to person, place, and time.  Psychiatric:        Behavior: Behavior normal.      Musculoskeletal Exam: C-spine thoracic and lumbar spine with good range of motion.  Shoulder joints, elbow joints, wrist joints, MCPs PIPs and DIPs with good range of motion with no synovitis.  Hip joints, knee joints, ankles, MTPs and PIPs with good range of motion with no synovitis.  She has generalized hyperalgesia and positive tender points.  CDAI Exam: CDAI Score: -- Patient Global: --; Provider Global: -- Swollen: --; Tender: -- Joint Exam 01/12/2020   No joint exam has been documented for this visit   There is currently no information documented on the homunculus. Go to the Rheumatology activity and complete the homunculus joint exam.  Investigation: No additional findings.  Imaging: No results found.  Recent Labs: Lab Results  Component Value Date   WBC 4.1 01/07/2020   HGB 12.7 01/07/2020   PLT 166 01/07/2020   NA 139 01/07/2020   K 4.4 01/07/2020   CL 104 01/07/2020   CO2 28 01/07/2020   GLUCOSE 102 (H) 01/07/2020   BUN 9 01/07/2020   CREATININE 0.62 01/07/2020   BILITOT 0.2 01/07/2020   ALKPHOS 55 09/25/2014   AST 16 01/07/2020   ALT 18 01/07/2020   PROT 6.6 01/07/2020   PROT 6.8 01/07/2020   ALBUMIN 3.5 09/25/2014   CALCIUM 9.1 01/07/2020   GFRAA 131 01/07/2020  January 07, 2020 SPEP showed acute inflammatory  pattern, immunoglobulins normal, ANA 1: 320NS, ENA negative, CK 115, aldolase 3.5, TSH normal, ESR 46, vitamin D 37. August 21, 2018 lupus anticoagulant negative, anticardiolipin negative, beta-2 GP 1 -, complements normal.   Speciality Comments: No specialty comments available.  Procedures:  No procedures performed Allergies: Pineapple, Tinidazole, and Biaxin [clarithromycin]   Assessment / Plan:     Visit Diagnoses: Positive ANA (antinuclear antibody) - ANA 1:320 H, 1:80 Speckled, ENA negative, all other autoimmune labs negative.  I will check C3-C4.  She has no clinical features of lupus.  Myalgia-she has generalized hyperalgesia and positive tender points.  We discussed briefly about possibility of fibromyalgia syndrome.  She may benefit from Cymbalta.  I discussed the benefit of Cymbalta in the management of his stress and pain.  She was in  agreement.  We will start her on Cymbalta 30 mg p.o. daily which can be increased in the future.  Indications side effects contraindications were discussed at length.  I have also given her prescription for methocarbamol 500 mg 1 p.o. twice daily.  Have advised her to try to see if it makes her drowsy or not.  If it makes her drowsy she should not be driving.  She should only take it at bedtime or when she is at home.  Need for regular exercise was emphasized.    Elevated CK-repeat CK and aldolase are normal now.  Elevated sed rate -her sed rate is mildly elevated.  She had no muscular weakness.  She had no difficulty getting up from the squatting position.  I will obtain some additional labs today.  Plan: Rheumatoid factor, Cyclic citrul peptide antibody, IgG, 14-3-3 eta Protein, C3 and C4.  She complains of generalized bony pain.  I will obtain a total body bone scan.  Rash - Bx 06/17/18: sparse perivascular lymphocytic infiltrate w/ melanocytes in papillary dermis-consistent w/ inflammatory or post inflammatory pigmentary alteration.  She had mild  erythema over her left arm which is resolving.  Other fatigue-she gives history of increased fatigue.  She works night shift.  She also has most likely fibromyalgia.  Chondromalacia of right patella-she has difficulty climbing up stairs as she experiences some discomfort behind her kneecaps.  Vitamin D deficiency-most recent vitamin D level is normal.  History of carpal tunnel syndrome - Bilateral.  Patient states she was diagnosed with carpal tunnel syndrome in the past but she never followed up with the studies.  Prediabetes  Hx of migraines  Family history of systemic lupus erythematosus  Orders: Orders Placed This Encounter  Procedures  . NM Bone Scan Whole Body  . Rheumatoid factor  . Cyclic citrul peptide antibody, IgG  . 14-3-3 eta Protein  . C3 and C4   Meds ordered this encounter  Medications  . methocarbamol (ROBAXIN) 500 MG tablet    Sig: Take 1 tablet (500 mg total) by mouth 2 (two) times daily. 1 tablet p.o. twice daily as needed muscle spasm.    Dispense:  60 tablet    Refill:  0  . DULoxetine (CYMBALTA) 30 MG capsule    Sig: Take 1 capsule (30 mg total) by mouth daily.    Dispense:  30 capsule    Refill:  1    Order Specific Question:   Supervising Provider    Answer:   Bo Merino [[id number]]     Follow-Up Instructions: Return in about 4 weeks (around 02/09/2020) for Myalgia.   Bo Merino, MD  Note - This record has been created using Editor, commissioning.  Chart creation errors have been sought, but may not always  have been located. Such creation errors do not reflect on  the standard of medical care.

## 2020-01-11 NOTE — Progress Notes (Signed)
I will discuss results at the follow-up visit.

## 2020-01-12 ENCOUNTER — Encounter: Payer: Self-pay | Admitting: Physician Assistant

## 2020-01-12 ENCOUNTER — Other Ambulatory Visit: Payer: Self-pay

## 2020-01-12 ENCOUNTER — Ambulatory Visit: Payer: 59 | Admitting: Rheumatology

## 2020-01-12 VITALS — BP 102/71 | HR 96 | Resp 14 | Ht 62.5 in | Wt 196.0 lb

## 2020-01-12 DIAGNOSIS — R768 Other specified abnormal immunological findings in serum: Secondary | ICD-10-CM

## 2020-01-12 DIAGNOSIS — M791 Myalgia, unspecified site: Secondary | ICD-10-CM | POA: Diagnosis not present

## 2020-01-12 DIAGNOSIS — R700 Elevated erythrocyte sedimentation rate: Secondary | ICD-10-CM | POA: Diagnosis not present

## 2020-01-12 DIAGNOSIS — Z8269 Family history of other diseases of the musculoskeletal system and connective tissue: Secondary | ICD-10-CM

## 2020-01-12 DIAGNOSIS — R21 Rash and other nonspecific skin eruption: Secondary | ICD-10-CM

## 2020-01-12 DIAGNOSIS — R52 Pain, unspecified: Secondary | ICD-10-CM

## 2020-01-12 DIAGNOSIS — R5383 Other fatigue: Secondary | ICD-10-CM

## 2020-01-12 DIAGNOSIS — M2241 Chondromalacia patellae, right knee: Secondary | ICD-10-CM

## 2020-01-12 DIAGNOSIS — E559 Vitamin D deficiency, unspecified: Secondary | ICD-10-CM

## 2020-01-12 DIAGNOSIS — R7303 Prediabetes: Secondary | ICD-10-CM

## 2020-01-12 DIAGNOSIS — R748 Abnormal levels of other serum enzymes: Secondary | ICD-10-CM

## 2020-01-12 DIAGNOSIS — Z8669 Personal history of other diseases of the nervous system and sense organs: Secondary | ICD-10-CM

## 2020-01-12 MED ORDER — METHOCARBAMOL 500 MG PO TABS
500.0000 mg | ORAL_TABLET | Freq: Two times a day (BID) | ORAL | 0 refills | Status: DC
Start: 2020-01-12 — End: 2020-08-02

## 2020-01-12 MED ORDER — DULOXETINE HCL 30 MG PO CPEP
30.0000 mg | ORAL_CAPSULE | Freq: Every day | ORAL | 1 refills | Status: DC
Start: 2020-01-12 — End: 2020-03-06

## 2020-01-12 NOTE — Progress Notes (Signed)
Pharmacy Note  Subjective:  Patient presents today to [facility] for follow up office visit.   Patient seen by the pharmacist for counseling on duloxetine (Cymbalta).    Objective: Vitals:   01/12/20 0913  BP: 102/71  Pulse: 96  Resp: 14    CMP     Component Value Date/Time   NA 139 01/07/2020 1258   K 4.4 01/07/2020 1258   CL 104 01/07/2020 1258   CO2 28 01/07/2020 1258   GLUCOSE 102 (H) 01/07/2020 1258   BUN 9 01/07/2020 1258   CREATININE 0.62 01/07/2020 1258   CALCIUM 9.1 01/07/2020 1258   PROT 6.6 01/07/2020 1258   PROT 6.8 01/07/2020 1258   ALBUMIN 3.5 09/25/2014 1142   AST 16 01/07/2020 1258   ALT 18 01/07/2020 1258   ALKPHOS 55 09/25/2014 1142   BILITOT 0.2 01/07/2020 1258   GFRNONAA 113 01/07/2020 1258   GFRAA 131 01/07/2020 1258    Assessment/Plan:  Patient was prescribed duloxetine 30mg  mg daily.  Patient was counseled on the purpose, proper use, and adverse effects of duloxetine including nausea, constipation, dizziness, confusion, blurry vision, increased blood pressure, increased sweating, headache, and urinary retention.  Reviewed black boxed warning of increased risk of suicidal thoughts in young adults.  Provided patient with educational materials on duloxetine and answered all questions.    , PharmD, [place], CPP Clinical Specialty Pharmacist (Rheumatology and Pulmonology)  01/12/2020 9:44 AM

## 2020-01-17 LAB — SJOGRENS SYNDROME-B EXTRACTABLE NUCLEAR ANTIBODY: SSB (La) (ENA) Antibody, IgG: <1 AI

## 2020-01-17 LAB — COMPLETE METABOLIC PANEL WITH GFR
AG Ratio: 1.4 (calc) (ref 1.0–2.5)
ALT: 18 U/L (ref 6–29)
AST: 16 U/L (ref 10–30)
Albumin: 3.8 g/dL (ref 3.6–5.1)
Alkaline phosphatase (APISO): 61 U/L (ref 31–125)
BUN: 9 mg/dL (ref 7–25)
CO2: 28 mmol/L (ref 20–32)
Calcium: 9.1 mg/dL (ref 8.6–10.2)
Chloride: 104 mmol/L (ref 98–110)
Creat: 0.62 mg/dL (ref 0.50–1.10)
GFR, Est African American: 131 mL/min/{1.73_m2} (ref >=60)
GFR, Est Non African American: 113 mL/min/{1.73_m2} (ref >=60)
Globulin: 2.8 g/dL (calc) (ref 1.9–3.7)
Glucose, Bld: 102 mg/dL — ABNORMAL HIGH (ref 65–99)
Potassium: 4.4 mmol/L (ref 3.5–5.3)
Sodium: 139 mmol/L (ref 135–146)
Total Bilirubin: 0.2 mg/dL (ref 0.2–1.2)
Total Protein: 6.6 g/dL (ref 6.1–8.1)

## 2020-01-17 LAB — CBC WITH DIFFERENTIAL/PLATELET
Absolute Monocytes: 459 cells/uL (ref 200–950)
Basophils Absolute: 21 cells/uL (ref 0–200)
Basophils Relative: 0.5 %
Eosinophils Absolute: 82 cells/uL (ref 15–500)
Eosinophils Relative: 2 %
HCT: 39.9 % (ref 35.0–45.0)
Hemoglobin: 12.7 g/dL (ref 11.7–15.5)
Lymphs Abs: 1722 cells/uL (ref 850–3900)
MCH: 26.3 pg — ABNORMAL LOW (ref 27.0–33.0)
MCHC: 31.8 g/dL — ABNORMAL LOW (ref 32.0–36.0)
MCV: 82.6 fL (ref 80.0–100.0)
MPV: 12.2 fL (ref 7.5–12.5)
Monocytes Relative: 11.2 %
Neutro Abs: 1816 cells/uL (ref 1500–7800)
Neutrophils Relative %: 44.3 %
Platelets: 166 10*3/uL (ref 140–400)
RBC: 4.83 10*6/uL (ref 3.80–5.10)
RDW: 12.2 % (ref 11.0–15.0)
Total Lymphocyte: 42 %
WBC: 4.1 10*3/uL (ref 3.8–10.8)

## 2020-01-17 LAB — MYOSITIS ASSESSR PLUS JO-1 AUTOABS
EJ Autoabs: NOT DETECTED
Jo-1 Autoabs: <1 AI
Ku Autoabs: NOT DETECTED
Mi-2 Autoabs: NOT DETECTED
OJ Autoabs: NOT DETECTED
PL-12 Autoabs: NOT DETECTED
PL-7 Autoabs: NOT DETECTED
SRP Autoabs: NOT DETECTED

## 2020-01-17 LAB — ANTI-NUCLEAR AB-TITER (ANA TITER): ANA Titer 1: 1:320 {titer} — ABNORMAL HIGH

## 2020-01-17 LAB — SJOGRENS SYNDROME-A EXTRACTABLE NUCLEAR ANTIBODY: SSA (Ro) (ENA) Antibody, IgG: <1 AI

## 2020-01-17 LAB — TSH: TSH: 2.02 mIU/L

## 2020-01-17 LAB — IGG, IGA, IGM
IgG (Immunoglobin G), Serum: 1106 mg/dL (ref 600–1640)
IgM, Serum: 110 mg/dL (ref 50–300)
Immunoglobulin A: 222 mg/dL (ref 47–310)

## 2020-01-17 LAB — CK: Total CK: 115 U/L (ref 29–143)

## 2020-01-17 LAB — PROTEIN ELECTROPHORESIS, SERUM, WITH REFLEX
Albumin ELP: 3.4 g/dL — ABNORMAL LOW (ref 3.8–4.8)
Alpha 1: 0.4 g/dL — ABNORMAL HIGH (ref 0.2–0.3)
Alpha 2: 1.1 g/dL — ABNORMAL HIGH (ref 0.5–0.9)
Beta 2: 0.5 g/dL (ref 0.2–0.5)
Beta Globulin: 0.4 g/dL (ref 0.4–0.6)
Gamma Globulin: 1.1 g/dL (ref 0.8–1.7)
Total Protein: 6.8 g/dL (ref 6.1–8.1)

## 2020-01-17 LAB — ALDOLASE: Aldolase: 3.5 U/L (ref <=8.1)

## 2020-01-17 LAB — ANTI-SMITH ANTIBODY: ENA SM Ab Ser-aCnc: <1 AI

## 2020-01-17 LAB — VITAMIN D 25 HYDROXY (VIT D DEFICIENCY, FRACTURES): Vit D, 25-Hydroxy: 37 ng/mL (ref 30–100)

## 2020-01-17 LAB — RNP ANTIBODY: Ribonucleic Protein(ENA) Antibody, IgG: <1 AI

## 2020-01-17 LAB — SEDIMENTATION RATE: Sed Rate: 46 mm/h — ABNORMAL HIGH (ref 0–20)

## 2020-01-17 LAB — ANTI-SCLERODERMA ANTIBODY: Scleroderma (Scl-70) (ENA) Antibody, IgG: <1 AI

## 2020-01-17 LAB — ANA: Anti Nuclear Antibody (ANA): POSITIVE — AB

## 2020-01-18 ENCOUNTER — Telehealth: Payer: Self-pay | Admitting: Rheumatology

## 2020-01-18 NOTE — Telephone Encounter (Signed)
Authorized [id number] valid from 01/14/2020 until 8l/16/2021

## 2020-01-18 NOTE — Telephone Encounter (Signed)
A peer to peer is required to obtain more information in reference to PA for NM whole body bone scan. Please call [phone] option #3. Please reference Case # [id number].

## 2020-01-20 LAB — CYCLIC CITRUL PEPTIDE ANTIBODY, IGG: Cyclic Citrullin Peptide Ab: 37 UNITS — ABNORMAL HIGH

## 2020-01-20 LAB — 14-3-3 ETA PROTEIN: 14-3-3 eta Protein: <0.2 ng/mL (ref <0.2)

## 2020-01-20 LAB — C3 AND C4
C3 Complement: 165 mg/dL (ref 83–193)
C4 Complement: 46 mg/dL (ref 15–57)

## 2020-01-20 LAB — RHEUMATOID FACTOR: Rheumatoid fact SerPl-aCnc: <14 IU/mL (ref <14)

## 2020-01-28 NOTE — Progress Notes (Deleted)
Office Visit Note  Patient: [name]             Date of Birth: 1980/08/10           MRN: [id number]             PCP: Juluis Rainier, MD Referring: Juluis Rainier, MD Visit Date: 02/10/2020 Occupation: @GUAROCC @  Subjective:  No chief complaint on file.   History of Present Illness: [name] is a 39 y.o. female ***   Activities of Daily Living:  Patient reports morning stiffness for *** {minute/hour:19697}.   Patient {ACTIONS;DENIES/REPORTS:21021675::"Denies"} nocturnal pain.  Difficulty dressing/grooming: {ACTIONS;DENIES/REPORTS:21021675::"Denies"} Difficulty climbing stairs: {ACTIONS;DENIES/REPORTS:21021675::"Denies"} Difficulty getting out of chair: {ACTIONS;DENIES/REPORTS:21021675::"Denies"} Difficulty using hands for taps, buttons, cutlery, and/or writing: {ACTIONS;DENIES/REPORTS:21021675::"Denies"}  No Rheumatology ROS completed.   PMFS History:  Patient Active Problem List   Diagnosis Date Noted  . Family history of systemic lupus erythematosus 09/16/2018  . Hx of migraines 08/21/2018  . Prediabetes 08/21/2018  . Vitamin D deficiency 08/21/2018  . History of carpal tunnel syndrome 08/21/2018    Past Medical History:  Diagnosis Date  . Healthy adult   . Migraine     Family History  Problem Relation Age of Onset  . Hypertension Mother   . Diabetes Mother   . Asthma Mother   . Asthma Father   . Heart attack Father   . Hypertension Brother   . Heart attack Brother   . Asthma Brother   . Healthy Son   . Healthy Daughter    Past Surgical History:  Procedure Laterality Date  . ABLATION    . CHOLECYSTECTOMY    . TONSILLECTOMY    . TUBAL LIGATION     Social History   Social History Narrative   Lives with two children   Drinks caffeine every once in a while     There is no immunization history on file for this patient.   Objective: Vital Signs: There were no vitals taken for this visit.   Physical Exam   Musculoskeletal Exam:  ***  CDAI Exam: CDAI Score: -- Patient Global: --; Provider Global: -- Swollen: --; Tender: -- Joint Exam 02/10/2020   No joint exam has been documented for this visit   There is currently no information documented on the homunculus. Go to the Rheumatology activity and complete the homunculus joint exam.  Investigation: No additional findings.  Imaging: No results found.  Recent Labs: Lab Results  Component Value Date   WBC 4.1 01/07/2020   HGB 12.7 01/07/2020   PLT 166 01/07/2020   NA 139 01/07/2020   K 4.4 01/07/2020   CL 104 01/07/2020   CO2 28 01/07/2020   GLUCOSE 102 (H) 01/07/2020   BUN 9 01/07/2020   CREATININE 0.62 01/07/2020   BILITOT 0.2 01/07/2020   ALKPHOS 55 09/25/2014   AST 16 01/07/2020   ALT 18 01/07/2020   PROT 6.6 01/07/2020   PROT 6.8 01/07/2020   ALBUMIN 3.5 09/25/2014   CALCIUM 9.1 01/07/2020   GFRAA 131 01/07/2020    Speciality Comments: No specialty comments available.  Procedures:  No procedures performed Allergies: Pineapple, Tinidazole, and Biaxin [clarithromycin]   Assessment / Plan:     Visit Diagnoses: No diagnosis found.  Orders: No orders of the defined types were placed in this encounter.  No orders of the defined types were placed in this encounter.   Face-to-face time spent with patient was *** minutes. Greater than 50% of time was spent in counseling and coordination of  care.  Follow-Up Instructions: No follow-ups on file.   Earnestine Mealing, CMA  Note - This record has been created using Editor, commissioning.  Chart creation errors have been sought, but may not always  have been located. Such creation errors do not reflect on  the standard of medical care.

## 2020-02-02 ENCOUNTER — Encounter (HOSPITAL_COMMUNITY)
Admission: RE | Admit: 2020-02-02 | Discharge: 2020-02-02 | Disposition: A | Discharge status: Home / Self Care | Payer: 59 | Source: Ambulatory Visit | Attending: Rheumatology | Admitting: Rheumatology

## 2020-02-02 ENCOUNTER — Other Ambulatory Visit: Payer: Self-pay

## 2020-02-02 DIAGNOSIS — R52 Pain, unspecified: Secondary | ICD-10-CM

## 2020-02-02 DIAGNOSIS — R700 Elevated erythrocyte sedimentation rate: Secondary | ICD-10-CM | POA: Diagnosis not present

## 2020-02-02 MED ORDER — TECHNETIUM TC 99M MEDRONATE IV KIT
20.4000 | PACK | Freq: Once | INTRAVENOUS | Status: AC
  Administered 2020-02-02: 20.4 via INTRAVENOUS

## 2020-02-03 NOTE — Progress Notes (Signed)
I discussed results with the patient.  There is increased uptake in the distal radius.  Please order x-ray sternum as per radiology recommendations at [facility].

## 2020-02-07 ENCOUNTER — Other Ambulatory Visit: Payer: Self-pay | Admitting: *Deleted

## 2020-02-07 DIAGNOSIS — R700 Elevated erythrocyte sedimentation rate: Secondary | ICD-10-CM

## 2020-02-09 ENCOUNTER — Ambulatory Visit: Payer: 59 | Admitting: Rheumatology

## 2020-02-09 ENCOUNTER — Encounter: Payer: Self-pay | Admitting: Rheumatology

## 2020-02-09 ENCOUNTER — Other Ambulatory Visit: Payer: Self-pay

## 2020-02-09 VITALS — BP 124/81 | HR 90 | Resp 16 | Ht 62.5 in | Wt 199.0 lb

## 2020-02-09 DIAGNOSIS — M2241 Chondromalacia patellae, right knee: Secondary | ICD-10-CM

## 2020-02-09 DIAGNOSIS — R748 Abnormal levels of other serum enzymes: Secondary | ICD-10-CM

## 2020-02-09 DIAGNOSIS — R768 Other specified abnormal immunological findings in serum: Secondary | ICD-10-CM | POA: Diagnosis not present

## 2020-02-09 DIAGNOSIS — R21 Rash and other nonspecific skin eruption: Secondary | ICD-10-CM

## 2020-02-09 DIAGNOSIS — M797 Fibromyalgia: Secondary | ICD-10-CM

## 2020-02-09 DIAGNOSIS — Z8669 Personal history of other diseases of the nervous system and sense organs: Secondary | ICD-10-CM

## 2020-02-09 DIAGNOSIS — Z8269 Family history of other diseases of the musculoskeletal system and connective tissue: Secondary | ICD-10-CM

## 2020-02-09 DIAGNOSIS — R7303 Prediabetes: Secondary | ICD-10-CM

## 2020-02-09 DIAGNOSIS — E559 Vitamin D deficiency, unspecified: Secondary | ICD-10-CM

## 2020-02-09 NOTE — Progress Notes (Signed)
Office Visit Note  Patient: [name]             Date of Birth: [birth year]           MRN: [id number]             PCP: Leighton Ruff, MD Referring: Leighton Ruff, MD Visit Date: 02/09/2020 Occupation: '@GUAROCC' @  Subjective:  Discuss lab work/bone scan resullts  History of Present Illness: [name] is a 39 y.o. female with history of fibromyalgia and elevated CK.  She reports she had a total body bone scan performed on 02/02/20, and she is planning on going for the recommended x-ray of the sternum tomorrow.  She was started on cymbalta 30 mg 1 capsule daily at her last OV on 01/12/20.  She is tolerating cymbalta without any side effects. She states her overall pain has improved slightly.  She continues to have pain on the anterior aspect of both shins. She also has pain in both knee joints, which is exacerbated by walking prolonged distances.  She denies any joint swelling. She states she continues to have significant fatigue and typically sleeps all day on her days off.  Activities of Daily Living:  Patient reports morning stiffness for 5-10  minutes.   Patient Reports nocturnal pain.  Difficulty dressing/grooming: Denies Difficulty climbing stairs: Reports Difficulty getting out of chair: Denies Difficulty using hands for taps, buttons, cutlery, and/or writing: Denies  Review of Systems  Constitutional: Positive for fatigue.  HENT: Positive for mouth dryness. Negative for mouth sores and nose dryness.   Eyes: Positive for dryness. Negative for pain and visual disturbance.  Respiratory: Negative for cough, hemoptysis, shortness of breath and difficulty breathing.   Cardiovascular: Negative for chest pain, palpitations, hypertension and swelling in legs/feet.  Gastrointestinal: Negative for blood in stool, constipation and diarrhea.  Endocrine: Negative for increased urination.  Genitourinary: Negative for difficulty urinating and painful urination.    Musculoskeletal: Positive for arthralgias, joint pain, myalgias, morning stiffness, muscle tenderness and myalgias. Negative for joint swelling and muscle weakness.  Skin: Negative for color change, pallor, rash, hair loss, nodules/bumps, redness, skin tightness, ulcers and sensitivity to sunlight.  Allergic/Immunologic: Negative for susceptible to infections.  Neurological: Positive for numbness. Negative for dizziness, headaches, memory loss and weakness.  Hematological: Positive for bruising/bleeding tendency. Negative for swollen glands.  Psychiatric/Behavioral: Negative for depressed mood, confusion and sleep disturbance. The patient is not nervous/anxious.     PMFS History:  Patient Active Problem List   Diagnosis Date Noted  . Family history of systemic lupus erythematosus 09/16/2018  . Hx of migraines 08/21/2018  . Prediabetes 08/21/2018  . Vitamin D deficiency 08/21/2018  . History of carpal tunnel syndrome 08/21/2018    Past Medical History:  Diagnosis Date  . Healthy adult   . Migraine     Family History  Problem Relation Age of Onset  . Hypertension Mother   . Diabetes Mother   . Asthma Mother   . Asthma Father   . Heart attack Father   . Hypertension Brother   . Heart attack Brother   . Asthma Brother   . Healthy Son   . Healthy Daughter    Past Surgical History:  Procedure Laterality Date  . ABLATION    . CHOLECYSTECTOMY    . TONSILLECTOMY    . TUBAL LIGATION     Social History   Social History Narrative   Lives with two children   Drinks caffeine every once in a while  There is no immunization history on file for this patient.   Objective: Vital Signs: BP 124/81 (BP Location: Right Arm, Patient Position: Sitting, Cuff Size: Normal)   Pulse 90   Resp 16   Ht 5' 2.5" (1.588 m)   Wt 199 lb (90.3 kg)   BMI 35.82 kg/m    Physical Exam Vitals and nursing note reviewed.  Constitutional:      Appearance: She is well-developed.  HENT:      Head: Normocephalic and atraumatic.  Eyes:     Conjunctiva/sclera: Conjunctivae normal.  Pulmonary:     Effort: Pulmonary effort is normal.  Abdominal:     General: Bowel sounds are normal.     Palpations: Abdomen is soft.  Musculoskeletal:     Cervical back: Normal range of motion.  Lymphadenopathy:     Cervical: No cervical adenopathy.  Skin:    General: Skin is warm and dry.     Capillary Refill: Capillary refill takes less than 2 seconds.  Neurological:     Mental Status: She is alert and oriented to person, place, and time.  Psychiatric:        Behavior: Behavior normal.      Musculoskeletal Exam: Generalized hyperalgesia. C-spine, thoracic spine, and lumbar spine good ROM.  Shoulder joints, elbow joints, wrist joints, MCPs, PIPs, and DIPs good ROM with no synovitis.  Hip joints, knee joints, and ankle joints good ROM with no discomfort.  No warmth or effusion of knee joints.  No tenderness or swelling of ankle joints.   CDAI Exam: CDAI Score: -- Patient Global: --; Provider Global: -- Swollen: --; Tender: -- Joint Exam 02/09/2020   No joint exam has been documented for this visit   There is currently no information documented on the homunculus. Go to the Rheumatology activity and complete the homunculus joint exam.  Investigation: No additional findings.  Imaging: NM Bone Scan Whole Body  Result Date: 02/03/2020 CLINICAL DATA:  Chronic knee pain, rheumatoid arthritis, elevated sed rate, D5 explained bone pain and muscle pain since June 2021, family history of lupus EXAM: NUCLEAR MEDICINE WHOLE BODY BONE SCAN TECHNIQUE: Whole body anterior and posterior images were obtained approximately 3 hours after intravenous injection of radiopharmaceutical. RADIOPHARMACEUTICALS:  20.4 mCi Technetium-82mMDP IV COMPARISON:  None Radiographic correlation: None recent FINDINGS: Mild uptake at the shoulders, sternoclavicular joints and elbows, typically degenerative. Minimal uptake at  anterior RIGHT knee, likely degenerative. Questionable tiny focus of uptake at the inferior sternum, nonspecific. No additional worrisome sites of tracer accumulation are identified. Expected urinary tract and soft tissue distribution of tracer. IMPRESSION: Scattered minor degenerative type uptake as above. Questionable tiny focus of nonspecific uptake at distal sternum, could be related to costosternal junction or sternum proper. Consider dedicated sternal radiographs, though this site may be difficult to adequately assess. Electronically Signed   By: MLavonia DanaM.D.   On: 02/03/2020 16:36    Recent Labs: Lab Results  Component Value Date   WBC 4.1 01/07/2020   HGB 12.7 01/07/2020   PLT 166 01/07/2020   NA 139 01/07/2020   K 4.4 01/07/2020   CL 104 01/07/2020   CO2 28 01/07/2020   GLUCOSE 102 (H) 01/07/2020   BUN 9 01/07/2020   CREATININE 0.62 01/07/2020   BILITOT 0.2 01/07/2020   ALKPHOS 55 09/25/2014   AST 16 01/07/2020   ALT 18 01/07/2020   PROT 6.6 01/07/2020   PROT 6.8 01/07/2020   ALBUMIN 3.5 09/25/2014   CALCIUM 9.1 01/07/2020  GFRAA 131 01/07/2020   January 07, 2020 SPEP showed acute inflammatory pattern, immunoglobulins normal, ANA 1: 320NS, ENA negative, CK 115, aldolase 3.5, TSH normal, ESR 46, vitamin D 37. Speciality Comments: No specialty comments available.  Procedures:  No procedures performed Allergies: Pineapple, Tinidazole, and Biaxin [clarithromycin]   Assessment / Plan:     Visit Diagnoses: Fibromyalgia -She has generalized hyperalgesia on exam.  She continues to have intermittent myalgias and muscle tenderness.  She had a total body bone scan performed on 02/02/2020 which revealed mild uptake at the shoulders, sternoclavicular joints, and elbows.  Questionable tiny focus of nonspecific uptake at distal sternum.  She is planning to have sternal radiographs performed tomorrow.  We will call her with those results.  We also reviewed lab work obtained on 01/12/2020.   Anti-CCP was 37, RF negative, '14 3 3 ' eta negative, and complements within normal limits.  She has no clinical features of rheumatoid arthritis at this time.  No synovitis was noted on examination today.  She was advised to notify us if she notices increased joint inflammation.   She was started on Cymbalta 30 mg 1 capsule by mouth daily at her last OV on 01/12/20.  She has started to notice improvement in her generalized pain and tender points since starting on cymbalta.  She is tolerating cymbalta without any side effects. She has been taking robaxin 500 mg BID PRN for muscle spasms, which is helpful.  She continues to have chronic fatigue secondary to insomnia.  She in under a tremendous amount of stress with work and acting as the caregiver for her mother.  We discussed the importance of stress management, regular exercise, and good sleep hygiene.  She declined physical therapy at this time.  She will continue taking cymbalta and robaxin as prescribed.  She does not need any refill at this time.  She will follow up in 3 months.    Elevated CK - Repeat CK was normal.  No muscle weakness noted on exam.   Positive ANA (antinuclear antibody) - ENA was negative. Complements WNL on 01/11/30.  She has no clinical features of autoimmune disease.   Rash - Bx 06/17/18: sparse perivascular lymphocytic infiltrate w/ melanocytes in papillary dermis-consistent w/ inflammatory or post inflammatory pigmentary alteration.  Chondromalacia of right patella: She has good ROM of the right knee joint on exam.  No warmth or effusion noted.   Vitamin D deficiency: She is taking vitamin D 5,000 units daily.   Other medical conditions are listed as follows:   History of carpal tunnel syndrome  Prediabetes  Hx of migraines  Family history of systemic lupus erythematosus  Orders: No orders of the defined types were placed in this encounter.  No orders of the defined types were placed in this  encounter.   Face-to-face time spent with patient was 30 minutes. Greater than 50% of time was spent in counseling and coordination of care.  Follow-Up Instructions: Return in 3 months (on 05/11/2020) for Fibromyalgia.   Bo Merino, MD   Scribed by-  Hazel Sams, PA-C  Note - This record has been created using Dragon software.  Chart creation errors have been sought, but may not always  have been located. Such creation errors do not reflect on  the standard of medical care.

## 2020-02-10 ENCOUNTER — Ambulatory Visit (HOSPITAL_COMMUNITY)
Admission: RE | Admit: 2020-02-10 | Discharge: 2020-02-10 | Disposition: A | Discharge status: Home / Self Care | Payer: 59 | Source: Ambulatory Visit | Attending: Rheumatology | Admitting: Rheumatology

## 2020-02-10 ENCOUNTER — Other Ambulatory Visit: Payer: Self-pay

## 2020-02-10 ENCOUNTER — Ambulatory Visit: Payer: 59 | Admitting: Physician Assistant

## 2020-02-10 DIAGNOSIS — R700 Elevated erythrocyte sedimentation rate: Secondary | ICD-10-CM | POA: Insufficient documentation

## 2020-02-11 ENCOUNTER — Ambulatory Visit: Payer: 59 | Admitting: Physician Assistant

## 2020-02-11 NOTE — Progress Notes (Signed)
X-ray indicates an old fracture in the lower part of the sternum.

## 2020-03-03 ENCOUNTER — Other Ambulatory Visit: Payer: Self-pay | Admitting: Rheumatology

## 2020-03-03 NOTE — Telephone Encounter (Signed)
Patient called stating at her last appointment Dr. Corliss Skains discussed increasing the dosage of her Cymbalta medication.  Patient states she currently takes 30 mg capsule, but is still experiencing pain in both feet.  Patient states the bones on the top of her foot feel "like they are broken."    Patient is also requesting a return call regarding applying for a handicap sticker.

## 2020-03-03 NOTE — Telephone Encounter (Signed)
Patient states she is still having pain in the top of her feet which she believes from her Fibromyalgia. Patient is currently on Cymbalta 30 mg daily. Patient would like to know if she can increase to Cymbalta. Patient would also like to know if she can have a temporary handicap placard. Please advise.

## 2020-03-06 MED ORDER — DULOXETINE HCL 60 MG PO CPEP: 60.0000 mg | ORAL_CAPSULE | Freq: Every day | ORAL | 2 refills | Status: DC

## 2020-03-06 NOTE — Telephone Encounter (Signed)
It is okay to increase Cymbalta to 60 mg p.o. daily.  Okay to give her temporary handicap placard.

## 2020-03-06 NOTE — Telephone Encounter (Signed)
Patient advised she is okay to increase her Cymbalta and we can give her a temporary handicap placard. Patient advised she may come to the office to pick it up.

## 2020-03-07 ENCOUNTER — Other Ambulatory Visit: Payer: Self-pay | Admitting: Rheumatology

## 2020-04-21 NOTE — Progress Notes (Signed)
Office Visit Note  Patient: [name]             Date of Birth: [birth year]           MRN: [id number]             PCP: Juluis Rainier, MD Referring: Juluis Rainier, MD Visit Date: 05/03/2020 Occupation: @GUAROCC @  Subjective:  Other (bilateral foot pain, patient reports nail changes )   History of Present Illness: [name] is a 39 y.o. female with fibromyalgia syndrome.  She states she has been having increased pain and discomfort in her bilateral feet.  She has been using sandals as she does not tolerate the pressure of the shoes on her feet.  She has noticed improvement on Cymbalta.  She states she takes muscle relaxers only on as needed basis for muscle spasms.  She reports pain level on the scale of 0-10 about 6-7.  She continues to have some symptoms of carpal tunnel syndrome.  She is usually very exhausted when she goes home and has not been using carpal tunnel braces at nighttime.  She has been working overtime which is been very hard for her.  She states she has to go up and down the stairs and stay on her feet most of the time.  Activities of Daily Living:  Patient reports morning stiffness for several hours.   Patient Reports nocturnal pain.  Difficulty dressing/grooming: Denies Difficulty climbing stairs: Reports Difficulty getting out of chair: Reports Difficulty using hands for taps, buttons, cutlery, and/or writing: Denies  Review of Systems  Constitutional: Positive for fatigue.  HENT: Negative for mouth sores, mouth dryness and nose dryness.   Eyes: Positive for dryness. Negative for pain and itching.  Respiratory: Negative for shortness of breath and difficulty breathing.   Cardiovascular: Negative for chest pain and palpitations.  Gastrointestinal: Negative for blood in stool, constipation and diarrhea.  Endocrine: Negative for increased urination.  Genitourinary: Negative for difficulty urinating.  Musculoskeletal: Positive for arthralgias, joint  pain, myalgias, morning stiffness, muscle tenderness and myalgias. Negative for joint swelling.  Skin: Negative for color change, rash and redness.  Allergic/Immunologic: Negative for susceptible to infections.  Neurological: Positive for numbness and weakness. Negative for dizziness, headaches and memory loss.  Hematological: Negative for bruising/bleeding tendency.  Psychiatric/Behavioral: Negative for confusion.    PMFS History:  Patient Active Problem List   Diagnosis Date Noted  . Fibromyalgia 05/03/2020  . Chondromalacia of right patella 05/03/2020  . Family history of systemic lupus erythematosus 09/16/2018  . Hx of migraines 08/21/2018  . Prediabetes 08/21/2018  . Vitamin D deficiency 08/21/2018  . History of carpal tunnel syndrome 08/21/2018    Past Medical History:  Diagnosis Date  . Healthy adult   . Migraine     Family History  Problem Relation Age of Onset  . Hypertension Mother   . Diabetes Mother   . Asthma Mother   . Asthma Father   . Heart attack Father   . Hypertension Brother   . Heart attack Brother   . Asthma Brother   . Healthy Son   . Healthy Daughter    Past Surgical History:  Procedure Laterality Date  . ABLATION    . CHOLECYSTECTOMY    . TONSILLECTOMY    . TUBAL LIGATION     Social History   Social History Narrative   Lives with two children   Drinks caffeine every once in a while     There is no immunization history  on file for this patient.   Objective: Vital Signs: BP 131/84 (BP Location: Left Arm, Patient Position: Sitting, Cuff Size: Normal)   Pulse (!) 102   Resp 15   Ht 5' 2.5" (1.588 m)   Wt 202 lb 6.4 oz (91.8 kg)   BMI 36.43 kg/m    Physical Exam Vitals and nursing note reviewed.  Constitutional:      Appearance: She is well-developed.  HENT:     Head: Normocephalic and atraumatic.  Eyes:     Conjunctiva/sclera: Conjunctivae normal.  Cardiovascular:     Rate and Rhythm: Normal rate and regular rhythm.      Heart sounds: Normal heart sounds.  Pulmonary:     Effort: Pulmonary effort is normal.     Breath sounds: Normal breath sounds.  Abdominal:     General: Bowel sounds are normal.     Palpations: Abdomen is soft.  Musculoskeletal:     Cervical back: Normal range of motion.  Lymphadenopathy:     Cervical: No cervical adenopathy.  Skin:    General: Skin is warm and dry.     Capillary Refill: Capillary refill takes less than 2 seconds.  Neurological:     Mental Status: She is alert and oriented to person, place, and time.  Psychiatric:        Behavior: Behavior normal.      Musculoskeletal Exam: C-spine, thoracic and lumbar spine were in good range of motion.  Shoulder joints, elbow joints, wrist joints, MCPs PIPs and DIPs been good range of motion with no synovitis.  Hip joints, knee joints, ankles, MTPs and PIPs with good range of motion with no synovitis.  CDAI Exam: CDAI Score: -- Patient Global: --; Provider Global: -- Swollen: --; Tender: -- Joint Exam 05/03/2020   No joint exam has been documented for this visit   There is currently no information documented on the homunculus. Go to the Rheumatology activity and complete the homunculus joint exam.  Investigation: No additional findings.  Imaging: No results found.  Recent Labs: Lab Results  Component Value Date   WBC 4.1 01/07/2020   HGB 12.7 01/07/2020   PLT 166 01/07/2020   NA 139 01/07/2020   K 4.4 01/07/2020   CL 104 01/07/2020   CO2 28 01/07/2020   GLUCOSE 102 (H) 01/07/2020   BUN 9 01/07/2020   CREATININE 0.62 01/07/2020   BILITOT 0.2 01/07/2020   ALKPHOS 55 09/25/2014   AST 16 01/07/2020   ALT 18 01/07/2020   PROT 6.6 01/07/2020   PROT 6.8 01/07/2020   ALBUMIN 3.5 09/25/2014   CALCIUM 9.1 01/07/2020   GFRAA 131 01/07/2020    Speciality Comments: No specialty comments available.  Procedures:  No procedures performed Allergies: Pineapple, Tinidazole, and Biaxin [clarithromycin]   Assessment  / Plan:     Visit Diagnoses: Fibromyalgia-she continues to have generalized pain and discomfort.  She has multiple tender points.  She has done better since she has been taking Cymbalta but is still has discomfort.  She has been working 12-hour shifts which has been hard for her.  I have given her a work note with restrictions for light duty, avoiding stairs and prolonged standing.  Duration restriction was given for 3 months until her follow-up visit.  She has been taking muscle relaxers on as needed basis.  I would avoid gabapentin as she is concerned about weight gain.  I offered referral to integrative therapies but she declined that she does not have any time.  History  of carpal tunnel syndrome-use of carpal tunnel braces were discussed.  She states she is exhausted and forgets to use the carpal tunnel braces at night.  Chondromalacia of right patella-she continues to have discomfort in her right knee.  Has difficulty climbing stairs.  Pain in both feet-she complains of discomfort in her bilateral feet and burning sensation.  She has no warmth swelling effusion or synovitis on exam my examination.  I believe the discomfort is coming from fibromyalgia.  Positive ANA (antinuclear antibody)-she has no clinical features of autoimmune disease on my examination today.  Vitamin D deficiency-she is on vitamin D supplement.  Prediabetes  Hx of migraines  Family history of systemic lupus erythematosus  Orders: No orders of the defined types were placed in this encounter.  No orders of the defined types were placed in this encounter.     Follow-Up Instructions: Return in about 3 months (around 08/03/2020) for OA, FMS.   Pollyann Savoy, MD  Note - This record has been created using Animal nutritionist.  Chart creation errors have been sought, but may not always  have been located. Such creation errors do not reflect on  the standard of medical care.

## 2020-05-01 ENCOUNTER — Ambulatory Visit: Payer: 59 | Admitting: Rheumatology

## 2020-05-03 ENCOUNTER — Encounter: Payer: Self-pay | Admitting: Rheumatology

## 2020-05-03 ENCOUNTER — Ambulatory Visit: Payer: 59 | Admitting: Rheumatology

## 2020-05-03 ENCOUNTER — Other Ambulatory Visit: Payer: Self-pay

## 2020-05-03 VITALS — BP 131/84 | HR 102 | Resp 15 | Ht 62.5 in | Wt 202.4 lb

## 2020-05-03 DIAGNOSIS — E559 Vitamin D deficiency, unspecified: Secondary | ICD-10-CM

## 2020-05-03 DIAGNOSIS — M2241 Chondromalacia patellae, right knee: Secondary | ICD-10-CM | POA: Diagnosis not present

## 2020-05-03 DIAGNOSIS — M797 Fibromyalgia: Secondary | ICD-10-CM

## 2020-05-03 DIAGNOSIS — M79672 Pain in left foot: Secondary | ICD-10-CM

## 2020-05-03 DIAGNOSIS — Z8669 Personal history of other diseases of the nervous system and sense organs: Secondary | ICD-10-CM

## 2020-05-03 DIAGNOSIS — M79671 Pain in right foot: Secondary | ICD-10-CM | POA: Diagnosis not present

## 2020-05-03 DIAGNOSIS — R768 Other specified abnormal immunological findings in serum: Secondary | ICD-10-CM

## 2020-05-03 DIAGNOSIS — R748 Abnormal levels of other serum enzymes: Secondary | ICD-10-CM

## 2020-05-03 DIAGNOSIS — R7303 Prediabetes: Secondary | ICD-10-CM

## 2020-05-03 DIAGNOSIS — Z8269 Family history of other diseases of the musculoskeletal system and connective tissue: Secondary | ICD-10-CM

## 2020-05-09 ENCOUNTER — Other Ambulatory Visit: Payer: Self-pay | Admitting: Rheumatology

## 2020-05-09 NOTE — Telephone Encounter (Signed)
Last Visit: 05/03/2020 Next Visit: 08/02/2020  Okay to refill per Dr. Corliss Skains

## 2020-05-17 ENCOUNTER — Telehealth: Payer: Self-pay

## 2020-05-17 NOTE — Telephone Encounter (Signed)
Patient called to check if you received new FMLA paperwork recently to fill out.  Patient requested a return call.

## 2020-05-17 NOTE — Telephone Encounter (Signed)
Patient advised her paperwork has been completed and was faxed on 05/11/2020. Patient advised she may come by the office to pick up a copy.

## 2020-07-19 NOTE — Progress Notes (Signed)
Office Visit Note  Patient: [name]             Date of Birth: [birth year]           MRN: [id number]             PCP: Juluis Rainier, MD Referring: Juluis Rainier, MD Visit Date: 08/02/2020 Occupation: @GUAROCC @  Subjective:  Pain in both feet   History of Present Illness: [name] is a 40 y.o. female with history of fibromyalgia.  Patient reports she continues to have intermittent myalgias and muscle tenderness due to fibromyalgia.  She has muscle spasms intermittently and takes robaxin 500 mg BID PRN for muscle spasms.  She continues to take cymbalta 60 mg 1 capsule by mouth daily.  She presents today with ongoing pain on the dorsal aspect of both feet.  She denies any ankle joint pain or pain in her toes. She denies any joint swelling.  She has occasional discomfort in her right knee but denies any joint swelling.  She uses voltaren gel topically as needed for pain relief. She denies any recent rashes, oral or nasal ulcers, sicca symptoms,or  symptoms of raynaud's.        Activities of Daily Living:  Patient reports morning stiffness for 10-15 minutes.   Patient Reports nocturnal pain.  Difficulty dressing/grooming: Denies Difficulty climbing stairs: Reports Difficulty getting out of chair: Denies Difficulty using hands for taps, buttons, cutlery, and/or writing: Denies  Review of Systems  Constitutional: Positive for fatigue.  HENT: Negative for mouth sores, mouth dryness and nose dryness.   Eyes: Positive for dryness. Negative for pain and itching.  Respiratory: Negative for shortness of breath and difficulty breathing.   Cardiovascular: Negative for chest pain and palpitations.  Gastrointestinal: Negative for blood in stool, constipation and diarrhea.  Endocrine: Negative for increased urination.  Genitourinary: Negative for difficulty urinating.  Musculoskeletal: Positive for myalgias, morning stiffness, muscle tenderness and myalgias. Negative for  arthralgias, joint pain and joint swelling.  Skin: Negative for color change, rash and redness.  Allergic/Immunologic: Negative for susceptible to infections.  Neurological: Positive for numbness. Negative for dizziness, headaches, memory loss and weakness.  Hematological: Negative for bruising/bleeding tendency.  Psychiatric/Behavioral: Negative for confusion.    PMFS History:  Patient Active Problem List   Diagnosis Date Noted  . Fibromyalgia 05/03/2020  . Chondromalacia of right patella 05/03/2020  . Family history of systemic lupus erythematosus 09/16/2018  . Hx of migraines 08/21/2018  . Prediabetes 08/21/2018  . Vitamin D deficiency 08/21/2018  . History of carpal tunnel syndrome 08/21/2018    Past Medical History:  Diagnosis Date  . Healthy adult   . Migraine     Family History  Problem Relation Age of Onset  . Hypertension Mother   . Diabetes Mother   . Asthma Mother   . Asthma Father   . Heart attack Father   . Hypertension Brother   . Heart attack Brother   . Asthma Brother   . Healthy Son   . Healthy Daughter    Past Surgical History:  Procedure Laterality Date  . ABLATION    . CHOLECYSTECTOMY    . TONSILLECTOMY    . TUBAL LIGATION     Social History   Social History Narrative   Lives with two children   Drinks caffeine every once in a while     There is no immunization history on file for this patient.   Objective: Vital Signs: BP 124/86 (BP Location: Left Arm,  Patient Position: Sitting, Cuff Size: Normal)   Pulse 88   Resp 16   Ht 5' 2.5" (1.588 m)   Wt 205 lb 12.8 oz (93.4 kg)   BMI 37.04 kg/m    Physical Exam Vitals and nursing note reviewed.  Constitutional:      Appearance: She is well-developed and well-nourished.  HENT:     Head: Normocephalic and atraumatic.  Eyes:     Extraocular Movements: EOM normal.     Conjunctiva/sclera: Conjunctivae normal.  Cardiovascular:     Pulses: Intact distal pulses.  Pulmonary:     Effort:  Pulmonary effort is normal.  Abdominal:     Palpations: Abdomen is soft.  Musculoskeletal:     Cervical back: Normal range of motion.  Skin:    General: Skin is warm and dry.     Capillary Refill: Capillary refill takes less than 2 seconds.  Neurological:     Mental Status: She is alert and oriented to person, place, and time.  Psychiatric:        Mood and Affect: Mood and affect normal.        Behavior: Behavior normal.      Musculoskeletal Exam: C-spine, thoracic spine, and lumbar spine good ROM.  Shoulder joints, elbow joints, wrist joints, MCPs, PIPs, and DIPs good ROM with no synovitis.  Complete fist formation bilaterally.  Hip joints good ROM with no discomfort.  Knee joints good ROM with no discomfort.  No warmth or effuision of knee joints. Ankle joints good ROM with no tenderness or inflammation.  Tenderness on the dorsal aspect of both feet.  No tenderness over MTP joints.   CDAI Exam: CDAI Score: -- Patient Global: --; Provider Global: -- Swollen: --; Tender: -- Joint Exam 08/02/2020   No joint exam has been documented for this visit   There is currently no information documented on the homunculus. Go to the Rheumatology activity and complete the homunculus joint exam.  Investigation: No additional findings.  Imaging: No results found.  Recent Labs: Lab Results  Component Value Date   WBC 4.1 01/07/2020   HGB 12.7 01/07/2020   PLT 166 01/07/2020   NA 139 01/07/2020   K 4.4 01/07/2020   CL 104 01/07/2020   CO2 28 01/07/2020   GLUCOSE 102 (H) 01/07/2020   BUN 9 01/07/2020   CREATININE 0.62 01/07/2020   BILITOT 0.2 01/07/2020   ALKPHOS 55 09/25/2014   AST 16 01/07/2020   ALT 18 01/07/2020   PROT 6.6 01/07/2020   PROT 6.8 01/07/2020   ALBUMIN 3.5 09/25/2014   CALCIUM 9.1 01/07/2020   GFRAA 131 01/07/2020    Speciality Comments: No specialty comments available.  Procedures:  No procedures performed Allergies: Pineapple, Tinidazole, and Biaxin  [clarithromycin]   Assessment / Plan:     Visit Diagnoses: Fibromyalgia -She has intermittent myalgias and muscle tenderness due to fibromyalgia.  She continues to take Cymbalta 60 mg 1 tab by mouth daily which she has found to be effective at managing her symptoms.  She has been experiencing increased muscle spasms recently.  She continues to take Robaxin 500 mg twice daily as needed for muscle spasms.  Refill sent to the pharmacy today.  She continues to have chronic fatigue which has been stable.  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  She has been having to work overtime which she feels has exacerbated her generalized pain.  She also has been experiencing persistent  pain on the dorsal aspect of both feet.  Discussed the use of voltaren gel and proper fitting shoes. She will be reapply for [facility] in February 2021 and will have necessary paperwork sent to our office to renewal. Discussed the importance of regular exercise and good sleep hygiene.  She will follow up in 6 months.  History of carpal tunnel syndrome: She has occasional paresthesias in both hands due to CTS.  She was encouraged to use carpal tunnel night splints for symptomatic relief.  Chondromalacia of right patella: She has occasional discomfort in right knee joint. She uses voltaren gel topically as needed for pain relief.  She has good ROM of the right knee joint on exam with no discomfort.  No warmth or effusion of the right knee joint.  Pain in both feet: X-rays of both feet obtained on 01/28/19 by Dr. Erlinda Hong. She presents today with persistent pain in both feet.  Her discomfort is most severe on the dorsal aspect of both feet, especially when wearing her tennis shoes.  She typically does not experience pain in her work boots or slide on shoes.  She has not had any injuires or nocturnal pain. She has good range of motion of both ankle joints with no discomfort.  No tenderness or  inflammation was noted.  No tenderness of MTP joints.  Thickening of bilateral 1st MTP joints noted.  No dorsal spur noted.  No tenderness along the Achilles tendon or plantar fascia.  ANA and anti-CCP positive on 01/07/20.  RF and 14-3-3 eta negative.  She has not clinical features of rheumatoid arthritis at this time. She declined updated x-rays.  She declined scheduling ultrasound of both feet to assess for synovitis.  We discussed the use of Voltaren gel which she can apply as needed.  Discussed the importance of wearing proper fitting shoes. She was advised to notify us if her feet pain persists or worsens. A temporary handicap placard renewal was provided to the patient today.   Positive ANA (antinuclear antibody): ANA 1:320 Nuclear, dense fine speckled, Anti-CCP 37:  Smith negative and dsDNA negative on 01/07/20. She has no clinical features of systemic lupus at this time.  She has not had any recent rashes, oral or nasal ulcers, sicca symptoms, or symptoms of raynaud's. She was advised to notify us if she develops any new or worsening symptoms.   Vitamin D deficiency: She is taking vitamin D 5,000 units daily.   Other medical conditions are listed as follows:   Hx of migraines  Prediabetes  Family history of systemic lupus erythematosus  Orders: No orders of the defined types were placed in this encounter.  Meds ordered this encounter  Medications  . DULoxetine (CYMBALTA) 60 MG capsule    Sig: TAKE 1 CAPSULE BY MOUTH EVERY DAY    Dispense:  60 capsule    Refill:  2  . methocarbamol (ROBAXIN) 500 MG tablet    Sig: Take 1 tablet (500 mg total) by mouth 2 (two) times daily as needed for muscle spasms.    Dispense:  60 tablet    Refill:  0      Follow-Up Instructions: Return in about 6 months (around 01/30/2021) for Fibromyalgia.   Ofilia Neas, PA-C  Note - This record has been created using Dragon software.  Chart creation errors have been sought, but may not always  have  been located. Such creation errors do not reflect on  the standard of medical care.

## 2020-08-02 ENCOUNTER — Other Ambulatory Visit: Payer: Self-pay

## 2020-08-02 ENCOUNTER — Ambulatory Visit: Payer: 59 | Admitting: Physician Assistant

## 2020-08-02 ENCOUNTER — Encounter: Payer: Self-pay | Admitting: Physician Assistant

## 2020-08-02 VITALS — BP 124/86 | HR 88 | Resp 16 | Ht 62.5 in | Wt 205.8 lb

## 2020-08-02 DIAGNOSIS — Z8269 Family history of other diseases of the musculoskeletal system and connective tissue: Secondary | ICD-10-CM

## 2020-08-02 DIAGNOSIS — M797 Fibromyalgia: Secondary | ICD-10-CM | POA: Diagnosis not present

## 2020-08-02 DIAGNOSIS — R7303 Prediabetes: Secondary | ICD-10-CM

## 2020-08-02 DIAGNOSIS — R768 Other specified abnormal immunological findings in serum: Secondary | ICD-10-CM

## 2020-08-02 DIAGNOSIS — M79671 Pain in right foot: Secondary | ICD-10-CM | POA: Diagnosis not present

## 2020-08-02 DIAGNOSIS — M2241 Chondromalacia patellae, right knee: Secondary | ICD-10-CM | POA: Diagnosis not present

## 2020-08-02 DIAGNOSIS — M79672 Pain in left foot: Secondary | ICD-10-CM

## 2020-08-02 DIAGNOSIS — Z8669 Personal history of other diseases of the nervous system and sense organs: Secondary | ICD-10-CM | POA: Diagnosis not present

## 2020-08-02 DIAGNOSIS — E559 Vitamin D deficiency, unspecified: Secondary | ICD-10-CM

## 2020-08-02 MED ORDER — METHOCARBAMOL 500 MG PO TABS: 500.0000 mg | ORAL_TABLET | Freq: Two times a day (BID) | ORAL | 0 refills | Status: DC | PRN

## 2020-08-02 MED ORDER — DULOXETINE HCL 60 MG PO CPEP: ORAL_CAPSULE | ORAL | 2 refills | Status: DC

## 2020-10-05 ENCOUNTER — Telehealth: Payer: Self-pay

## 2020-10-05 ENCOUNTER — Encounter: Payer: Self-pay | Admitting: *Deleted

## 2020-10-05 NOTE — Telephone Encounter (Signed)
I called patient, work note at front desk, patient verbalized understanding.

## 2020-10-05 NOTE — Telephone Encounter (Signed)
Please advise 

## 2020-10-05 NOTE — Telephone Encounter (Signed)
Patient called requesting a doctor's note to excuse her for missing work on Monday, 3/21 and Tuesday, 10/03/20.  Patient states she was scheduled to work on Monday, 3/21 from 7:00 pm to 7:00 am and had to leave at 2:00 am due to extreme fatigue and not feeling well.  Patient states she fell asleep when she got home and slept until Wednesday, 10/04/20.  Patient states she was so exhausted she didn't have enough energy to get up to eat.  Patient states her FMLA ended on 09/11/20 and now work is requiring her to have a doctor's note for missing work.  Please advise.

## 2020-10-05 NOTE — Telephone Encounter (Signed)
Ok to provide work note for 10/02/20-10/03/20.  If her fatigue persists or worsens she will need to be further evaluated by her PCP or make an appointment with [place].

## 2021-01-17 NOTE — Progress Notes (Deleted)
Office Visit Note  Patient: [name]             Date of Birth: 03/07/1981           MRN: [id number]             PCP: Juluis Rainier, MD Referring: Juluis Rainier, MD Visit Date: 01/31/2021 Occupation: @GUAROCC @  Subjective:  No chief complaint on file.   History of Present Illness: [name] is a 40 y.o. female ***   Activities of Daily Living:  Patient reports morning stiffness for *** {minute/hour:19697}.   Patient {ACTIONS;DENIES/REPORTS:21021675::"Denies"} nocturnal pain.  Difficulty dressing/grooming: {ACTIONS;DENIES/REPORTS:21021675::"Denies"} Difficulty climbing stairs: {ACTIONS;DENIES/REPORTS:21021675::"Denies"} Difficulty getting out of chair: {ACTIONS;DENIES/REPORTS:21021675::"Denies"} Difficulty using hands for taps, buttons, cutlery, and/or writing: {ACTIONS;DENIES/REPORTS:21021675::"Denies"}  No Rheumatology ROS completed.   PMFS History:  Patient Active Problem List   Diagnosis Date Noted   Fibromyalgia 05/03/2020   Chondromalacia of right patella 05/03/2020   Family history of systemic lupus erythematosus 09/16/2018   Hx of migraines 08/21/2018   Prediabetes 08/21/2018   Vitamin D deficiency 08/21/2018   History of carpal tunnel syndrome 08/21/2018    Past Medical History:  Diagnosis Date   Healthy adult    Migraine     Family History  Problem Relation Age of Onset   Hypertension Mother    Diabetes Mother    Asthma Mother    Asthma Father    Heart attack Father    Hypertension Brother    Heart attack Brother    Asthma Brother    Healthy Son    Healthy Daughter    Past Surgical History:  Procedure Laterality Date   ABLATION     CHOLECYSTECTOMY     TONSILLECTOMY     TUBAL LIGATION     Social History   Social History Narrative   Lives with two children   Drinks caffeine every once in a while     There is no immunization history on file for this patient.   Objective: Vital Signs: There were no vitals taken for this  visit.   Physical Exam   Musculoskeletal Exam: ***  CDAI Exam: CDAI Score: -- Patient Global: --; Provider Global: -- Swollen: --; Tender: -- Joint Exam 01/31/2021   No joint exam has been documented for this visit   There is currently no information documented on the homunculus. Go to the Rheumatology activity and complete the homunculus joint exam.  Investigation: No additional findings.  Imaging: No results found.  Recent Labs: Lab Results  Component Value Date   WBC 4.1 01/07/2020   HGB 12.7 01/07/2020   PLT 166 01/07/2020   NA 139 01/07/2020   K 4.4 01/07/2020   CL 104 01/07/2020   CO2 28 01/07/2020   GLUCOSE 102 (H) 01/07/2020   BUN 9 01/07/2020   CREATININE 0.62 01/07/2020   BILITOT 0.2 01/07/2020   ALKPHOS 55 09/25/2014   AST 16 01/07/2020   ALT 18 01/07/2020   PROT 6.6 01/07/2020   PROT 6.8 01/07/2020   ALBUMIN 3.5 09/25/2014   CALCIUM 9.1 01/07/2020   GFRAA 131 01/07/2020    Speciality Comments: No specialty comments available.  Procedures:  No procedures performed Allergies: Pineapple, Tinidazole, and Biaxin [clarithromycin]   Assessment / Plan:     Visit Diagnoses: No diagnosis found.  Orders: No orders of the defined types were placed in this encounter.  No orders of the defined types were placed in this encounter.   Face-to-face time spent with patient was *** minutes. Greater  than 50% of time was spent in counseling and coordination of care.  Follow-Up Instructions: No follow-ups on file.   Earnestine Mealing, CMA  Note - This record has been created using Editor, commissioning.  Chart creation errors have been sought, but may not always  have been located. Such creation errors do not reflect on  the standard of medical care.

## 2021-01-31 ENCOUNTER — Ambulatory Visit: Payer: 59 | Admitting: Rheumatology

## 2021-03-07 ENCOUNTER — Telehealth: Payer: Self-pay | Admitting: Rheumatology

## 2021-03-07 NOTE — Telephone Encounter (Signed)
Patient calling to see if you would fill out a handicap placard form for her? Her current one is getting ready to expire. Please call patient when ready to pick up, or if one cannot be filled out.

## 2021-03-07 NOTE — Telephone Encounter (Signed)
Ok to fill out temporary handicap placard.  She will require a follow up visit prior to the next renewal.

## 2021-03-08 NOTE — Telephone Encounter (Signed)
Patient advised handicap placard is ready for pick up.  

## 2021-03-08 NOTE — Telephone Encounter (Signed)
Patient advised we will fill out temporary handicap placard.  Patient advised he will require a follow up visit prior to the next renewal. Patient states she has been without insurance and has recently started back to work and should have her insurance early September or October. Patient will call to schedule an appointment.

## 2021-04-06 NOTE — Progress Notes (Signed)
Office Visit Note  Patient: [name]             Date of Birth: May 06, 1981           MRN: [id number]             PCP: Juluis Rainier, MD Referring: Juluis Rainier, MD Visit Date: 04/19/2021 Occupation: @GUAROCC @  Subjective:  Right knee pain and right hand/arm pain.    History of Present Illness: [name] is a 40 y.o. female with a history of fibromyalgia, chondromalacia patella and osteoarthritis.  She states she has been having symptoms of bilateral carpal tunnel syndrome in her hands.  Her right hand is worse than her left hand.  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  She has not tried physical therapy.  She continues to have some discomfort in her feet.  She also has generalized pain from fibromyalgia.  Activities of Daily Living:  Patient reports morning stiffness for several hours.   Patient Reports nocturnal pain.  Difficulty dressing/grooming: Reports Difficulty climbing stairs: Reports Difficulty getting out of chair: Reports Difficulty using hands for taps, buttons, cutlery, and/or writing: Reports  Review of Systems  Constitutional:  Positive for fatigue.  HENT:  Negative for mouth sores, mouth dryness and nose dryness.   Eyes:  Positive for dryness. Negative for pain and itching.  Respiratory:  Negative for shortness of breath and difficulty breathing.   Cardiovascular:  Negative for chest pain and palpitations.  Gastrointestinal:  Negative for blood in stool, constipation and diarrhea.  Endocrine: Negative for increased urination.  Genitourinary:  Negative for difficulty urinating.  Musculoskeletal:  Positive for joint pain, joint pain, myalgias, morning stiffness, muscle tenderness and myalgias. Negative for joint swelling.  Skin:  Negative for color change, rash, redness and sensitivity to  sunlight.  Allergic/Immunologic: Negative for susceptible to infections.  Neurological:  Positive for numbness and weakness. Negative for dizziness, headaches and memory loss.  Hematological:  Positive for bruising/bleeding tendency. Negative for swollen glands.  Psychiatric/Behavioral:  Negative for depressed mood, confusion and sleep disturbance. The patient is not nervous/anxious.    PMFS History:  Patient Active Problem List   Diagnosis Date Noted   Fibromyalgia 05/03/2020   Chondromalacia of right patella 05/03/2020   Family history of systemic lupus erythematosus 09/16/2018   Hx of migraines 08/21/2018   Prediabetes 08/21/2018   Vitamin D deficiency 08/21/2018   History of carpal tunnel syndrome 08/21/2018    Past Medical History:  Diagnosis Date   Healthy adult    Migraine     Family History  Problem Relation Age of Onset   Hypertension Mother    Diabetes Mother    Asthma Mother    Asthma Father    Heart attack Father    Hypertension Brother    Heart attack Brother    Asthma Brother    Healthy Son    Healthy Daughter    Past Surgical History:  Procedure Laterality Date   ABLATION     CHOLECYSTECTOMY     TONSILLECTOMY     TUBAL LIGATION     Social History   Social History Narrative   Lives with two children   Drinks caffeine every once in a while     There is no immunization history on file for this patient.   Objective: Vital Signs: BP  130/83 (BP Location: Left Arm, Patient Position: Sitting, Cuff Size: Large)   Pulse 97   Ht 5' 2.5" (1.588 m)   Wt 220 lb (99.8 kg)   BMI 39.60 kg/m    Physical Exam Vitals and nursing note reviewed.  Constitutional:      Appearance: She is well-developed.  HENT:     Head: Normocephalic and atraumatic.  Eyes:     Conjunctiva/sclera: Conjunctivae normal.  Cardiovascular:     Rate and Rhythm: Normal rate and regular rhythm.     Heart sounds: Normal heart sounds.  Pulmonary:     Effort: Pulmonary effort is  normal.     Breath sounds: Normal breath sounds.  Abdominal:     General: Bowel sounds are normal.     Palpations: Abdomen is soft.  Musculoskeletal:     Cervical back: Normal range of motion.  Lymphadenopathy:     Cervical: No cervical adenopathy.  Skin:    General: Skin is warm and dry.     Capillary Refill: Capillary refill takes less than 2 seconds.  Neurological:     Mental Status: She is alert and oriented to person, place, and time.  Psychiatric:        Behavior: Behavior normal.     Musculoskeletal Exam: C-spine was in good range of motion.  Shoulder joints, elbow joints, wrist joints, MCPs PIPs and DIPs with good range of motion with no synovitis.  Tinel's and Phalen's test was negative.  Hip joints and knee joints with good range of motion.  She had discomfort range of motion of her right knee joint.  She had crepitus in her right knee joint.  There was no tenderness over ankles or MTPs.  CDAI Exam: CDAI Score: -- Patient Global: --; Provider Global: -- Swollen: --; Tender: -- Joint Exam 04/19/2021   No joint exam has been documented for this visit   There is currently no information documented on the homunculus. Go to the Rheumatology activity and complete the homunculus joint exam.  Investigation: No additional findings.  Imaging: No results found.  Recent Labs: Lab Results  Component Value Date   WBC 4.1 01/07/2020   HGB 12.7 01/07/2020   PLT 166 01/07/2020   NA 139 01/07/2020   K 4.4 01/07/2020   CL 104 01/07/2020   CO2 28 01/07/2020   GLUCOSE 102 (H) 01/07/2020   BUN 9 01/07/2020   CREATININE 0.62 01/07/2020   BILITOT 0.2 01/07/2020   ALKPHOS 55 09/25/2014   AST 16 01/07/2020   ALT 18 01/07/2020   PROT 6.6 01/07/2020   PROT 6.8 01/07/2020   ALBUMIN 3.5 09/25/2014   CALCIUM 9.1 01/07/2020   GFRAA 131 01/07/2020    Speciality Comments: No specialty comments available.  Procedures:  Large Joint Inj: R knee on 04/19/2021 12:07 PM Indications:  pain Details: 27 G 1.5 in needle, medial approach  Arthrogram: No  Medications: 40 mg triamcinolone acetonide 40 MG/ML; 1.5 mL lidocaine 1 % Aspirate: 0 mL Outcome: tolerated well, no immediate complications Procedure, treatment alternatives, risks and benefits explained, specific risks discussed. Consent was given by the patient. Immediately prior to procedure a time out was called to verify the correct patient, procedure, equipment, support staff and site/side marked as required. Patient was prepped and draped in the usual sterile fashion.    Allergies: Pineapple, Tinidazole, Other, and Biaxin [clarithromycin]   Assessment / Plan:     Visit Diagnoses: Fibromyalgia -she continues to have generalized pain and discomfort.  She states her  symptoms are manageable with Cymbalta 60 mg 1 tab by mouth daily, Robaxin 500 mg twice daily as needed for muscle spasms.  She continues to have intermittent flares.  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  She states her hours are better.  Although she has been having increasing discomfort in her hands.  No synovitis was noted.  Plan: Ambulatory referral to Physical Therapy  Paresthesia of both hands -she complains of paresthesias in her bilateral hands.  She states she was diagnosed with carpal tunnel syndrome many years ago.  She complains of discomfort in her hands radiating to her forearm.  I will schedule nerve conduction velocities to evaluate this further.  Plan: Ambulatory referral to Physical Medicine Rehab  History of carpal tunnel syndrome - She has occasional paresthesias in both hands due to CTS.   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  Per her request right knee joint was injected with cortisone as described above.  Side effects were  discussed.  She tolerated the procedure well.  Postprocedure instructions were given.  I will also refer her to physical therapy for lower extremity muscle strengthening exercises.  A handout on exercises was given.  Plan: Ambulatory referral to Physical Therapy  Pain in both feet - X-rays of both feet obtained on 01/28/19 by Dr. Roda Shutters  Medication monitoring encounter -as she is taking long-term medications we will check labs today.  Plan: COMPLETE METABOLIC PANEL WITH GFR, CBC with Differential/Platelet  Positive ANA (antinuclear antibody) - ANA 1:320 Nuclear, dense fine speckled, Anti-CCP 37:  Smith negative and dsDNA negative on 01/07/20.  There is no history of oral ulcers, nasal ulcers, malar rash, photosensitivity, Raynaud's phenomenon or inflammatory arthritis.  Prediabetes  Vitamin D deficiency  Hx of migraines  Family history of systemic lupus erythematosus  Orders: Orders Placed This Encounter  Procedures   Large Joint Inj   COMPLETE METABOLIC PANEL WITH GFR   CBC with Differential/Platelet   Ambulatory referral to Physical Therapy   Ambulatory referral to Physical Medicine Rehab    Meds ordered this encounter  Medications   DULoxetine (CYMBALTA) 60 MG capsule    Sig: TAKE 1 CAPSULE BY MOUTH EVERY DAY    Dispense:  90 capsule    Refill:  0   diclofenac Sodium (VOLTAREN) 1 % GEL    Sig: Apply 2-4 grams to affected joint 4 times daily as needed.    Dispense:  400 g    Refill:  2      Follow-Up Instructions: Return in about 6 months (around 10/18/2021) for Osteoarthritis.   Pollyann Savoy, MD  Note - This record has been created using Animal nutritionist.  Chart creation errors have been sought, but may not always  have been located. Such creation errors do not reflect on  the standard of medical care.

## 2021-04-19 ENCOUNTER — Telehealth: Payer: Self-pay | Admitting: *Deleted

## 2021-04-19 ENCOUNTER — Encounter: Payer: Self-pay | Admitting: Rheumatology

## 2021-04-19 ENCOUNTER — Ambulatory Visit: Payer: BC Managed Care – PPO | Admitting: Rheumatology

## 2021-04-19 ENCOUNTER — Other Ambulatory Visit: Payer: Self-pay

## 2021-04-19 VITALS — BP 130/83 | HR 97 | Ht 62.5 in | Wt 220.0 lb

## 2021-04-19 DIAGNOSIS — M79672 Pain in left foot: Secondary | ICD-10-CM

## 2021-04-19 DIAGNOSIS — M797 Fibromyalgia: Secondary | ICD-10-CM

## 2021-04-19 DIAGNOSIS — M79671 Pain in right foot: Secondary | ICD-10-CM

## 2021-04-19 DIAGNOSIS — E559 Vitamin D deficiency, unspecified: Secondary | ICD-10-CM

## 2021-04-19 DIAGNOSIS — M79641 Pain in right hand: Secondary | ICD-10-CM | POA: Diagnosis not present

## 2021-04-19 DIAGNOSIS — Z5181 Encounter for therapeutic drug level monitoring: Secondary | ICD-10-CM

## 2021-04-19 DIAGNOSIS — Z8669 Personal history of other diseases of the nervous system and sense organs: Secondary | ICD-10-CM | POA: Diagnosis not present

## 2021-04-19 DIAGNOSIS — R768 Other specified abnormal immunological findings in serum: Secondary | ICD-10-CM

## 2021-04-19 DIAGNOSIS — R202 Paresthesia of skin: Secondary | ICD-10-CM | POA: Diagnosis not present

## 2021-04-19 DIAGNOSIS — M2241 Chondromalacia patellae, right knee: Secondary | ICD-10-CM

## 2021-04-19 DIAGNOSIS — M79642 Pain in left hand: Secondary | ICD-10-CM

## 2021-04-19 DIAGNOSIS — Z8269 Family history of other diseases of the musculoskeletal system and connective tissue: Secondary | ICD-10-CM

## 2021-04-19 DIAGNOSIS — R7303 Prediabetes: Secondary | ICD-10-CM

## 2021-04-19 MED ORDER — TRIAMCINOLONE ACETONIDE 40 MG/ML IJ SUSP
40.0000 mg | INTRAMUSCULAR | Status: AC | PRN
  Administered 2021-04-19: 40 mg via INTRA_ARTICULAR

## 2021-04-19 MED ORDER — DULOXETINE HCL 60 MG PO CPEP: ORAL_CAPSULE | ORAL | 0 refills | Status: DC

## 2021-04-19 MED ORDER — LIDOCAINE HCL 1 % IJ SOLN
1.5000 mL | INTRAMUSCULAR | Status: AC | PRN
  Administered 2021-04-19: 1.5 mL

## 2021-04-19 MED ORDER — DICLOFENAC SODIUM 1 % EX GEL
CUTANEOUS | 2 refills | Status: AC
Start: 2021-04-19 — End: ?

## 2021-04-19 NOTE — Patient Instructions (Signed)
Knee Exercises Ask your health care provider which exercises are safe for you. Do exercises exactly as told by your health care provider and adjust them as directed. It is normal to feel mild stretching, pulling, tightness, or discomfort as you do these exercises. Stop right away if you feel sudden pain or your pain gets worse. Do not begin these exercises until told by your health care provider. Stretching and range-of-motion exercises These exercises warm up your muscles and joints and improve the movement and flexibility of your knee. These exercises also help to relieve pain and swelling. Knee extension, prone Lie on your abdomen (prone position) on a bed. Place your left / right knee just beyond the edge of the surface so your knee is not on the bed. You can put a towel under your left / right thigh just above your kneecap for comfort. Relax your leg muscles and allow gravity to straighten your knee (extension). You should feel a stretch behind your left / right knee. Hold this position for __________ seconds. Scoot up so your knee is supported between repetitions. Repeat __________ times. Complete this exercise __________ times a day. Knee flexion, active  Lie on your back with both legs straight. If this causes back discomfort, bend your left / right knee so your foot is flat on the floor. Slowly slide your left / right heel back toward your buttocks. Stop when you feel a gentle stretch in the front of your knee or thigh (flexion). Hold this position for __________ seconds. Slowly slide your left / right heel back to the starting position. Repeat __________ times. Complete this exercise __________ times a day. Quadriceps stretch, prone  Lie on your abdomen on a firm surface, such as a bed or padded floor. Bend your left / right knee and hold your ankle. If you cannot reach your ankle or pant leg, loop a belt around your foot and grab the belt instead. Gently pull your heel toward your  buttocks. Your knee should not slide out to the side. You should feel a stretch in the front of your thigh and knee (quadriceps). Hold this position for __________ seconds. Repeat __________ times. Complete this exercise __________ times a day. Hamstring, supine Lie on your back (supine position). Loop a belt or towel over the ball of your left / right foot. The ball of your foot is on the walking surface, right under your toes. Straighten your left / right knee and slowly pull on the belt to raise your leg until you feel a gentle stretch behind your knee (hamstring). Do not let your knee bend while you do this. Keep your other leg flat on the floor. Hold this position for __________ seconds. Repeat __________ times. Complete this exercise __________ times a day. Strengthening exercises These exercises build strength and endurance in your knee. Endurance is the ability to use your muscles for a long time, even after they get tired. Quadriceps, isometric This exercise stretches the muscles in front of your thigh (quadriceps) without moving your knee joint (isometric). Lie on your back with your left / right leg extended and your other knee bent. Put a rolled towel or small pillow under your knee if told by your health care provider. Slowly tense the muscles in the front of your left / right thigh. You should see your kneecap slide up toward your hip or see increased dimpling just above the knee. This motion will push the back of the knee toward the floor. For __________   seconds, hold the muscle as tight as you can without increasing your pain. Relax the muscles slowly and completely. Repeat __________ times. Complete this exercise __________ times a day. Straight leg raises This exercise stretches the muscles in front of your thigh (quadriceps) and the muscles that move your hips (hip flexors). Lie on your back with your left / right leg extended and your other knee bent. Tense the muscles in  the front of your left / right thigh. You should see your kneecap slide up or see increased dimpling just above the knee. Your thigh may even shake a bit. Keep these muscles tight as you raise your leg 4-6 inches (10-15 cm) off the floor. Do not let your knee bend. Hold this position for __________ seconds. Keep these muscles tense as you lower your leg. Relax your muscles slowly and completely after each repetition. Repeat __________ times. Complete this exercise __________ times a day. Hamstring, isometric Lie on your back on a firm surface. Bend your left / right knee about __________ degrees. Dig your left / right heel into the surface as if you are trying to pull it toward your buttocks. Tighten the muscles in the back of your thighs (hamstring) to "dig" as hard as you can without increasing any pain. Hold this position for __________ seconds. Release the tension gradually and allow your muscles to relax completely for __________ seconds after each repetition. Repeat __________ times. Complete this exercise __________ times a day. Hamstring curls If told by your health care provider, do this exercise while wearing ankle weights. Begin with __________ lb weights. Then increase the weight by 1 lb (0.5 kg) increments. Do not wear ankle weights that are more than __________ lb. Lie on your abdomen with your legs straight. Bend your left / right knee as far as you can without feeling pain. Keep your hips flat against the floor. Hold this position for __________ seconds. Slowly lower your leg to the starting position. Repeat __________ times. Complete this exercise __________ times a day. Squats This exercise strengthens the muscles in front of your thigh and knee (quadriceps). Stand in front of a table, with your feet and knees pointing straight ahead. You may rest your hands on the table for balance but not for support. Slowly bend your knees and lower your hips like you are going to sit in a  chair. Keep your weight over your heels, not over your toes. Keep your lower legs upright so they are parallel with the table legs. Do not let your hips go lower than your knees. Do not bend lower than told by your health care provider. If your knee pain increases, do not bend as low. Hold the squat position for __________ seconds. Slowly push with your legs to return to standing. Do not use your hands to pull yourself to standing. Repeat __________ times. Complete this exercise __________ times a day. Wall slides This exercise strengthens the muscles in front of your thigh and knee (quadriceps). Lean your back against a smooth wall or door, and walk your feet out 18-24 inches (46-61 cm) from it. Place your feet hip-width apart. Slowly slide down the wall or door until your knees bend __________ degrees. Keep your knees over your heels, not over your toes. Keep your knees in line with your hips. Hold this position for __________ seconds. Repeat __________ times. Complete this exercise __________ times a day. Straight leg raises This exercise strengthens the muscles that rotate the leg at the hip and   move it away from your body (hip abductors). Lie on your side with your left / right leg in the top position. Lie so your head, shoulder, knee, and hip line up. You may bend your bottom knee to help you keep your balance. Roll your hips slightly forward so your hips are stacked directly over each other and your left / right knee is facing forward. Leading with your heel, lift your top leg 4-6 inches (10-15 cm). You should feel the muscles in your outer hip lifting. Do not let your foot drift forward. Do not let your knee roll toward the ceiling. Hold this position for __________ seconds. Slowly return your leg to the starting position. Let your muscles relax completely after each repetition. Repeat __________ times. Complete this exercise __________ times a day. Straight leg raises This  exercise stretches the muscles that move your hips away from the front of the pelvis (hip extensors). Lie on your abdomen on a firm surface. You can put a pillow under your hips if that is more comfortable. Tense the muscles in your buttocks and lift your left / right leg about 4-6 inches (10-15 cm). Keep your knee straight as you lift your leg. Hold this position for __________ seconds. Slowly lower your leg to the starting position. Let your leg relax completely after each repetition. Repeat __________ times. Complete this exercise __________ times a day. This information is not intended to replace advice given to you by your health care provider. Make sure you discuss any questions you have with your health care provider. Document Revised: 04/21/2018 Document Reviewed: 04/21/2018 Elsevier Patient Education  2022 [employer].  

## 2021-04-19 NOTE — Telephone Encounter (Signed)
Received a fax regarding Prior Authorization from [facility] for  Diclofenac Gel . Authorization has been DENIED.

## 2021-04-19 NOTE — Telephone Encounter (Signed)
Patient advised PA was denied and she would have to get it over the counter. Patient expressed understanding.

## 2021-04-19 NOTE — Telephone Encounter (Signed)
Submitted a Prior Authorization request to  [employer]  for  Diclofenac Gel  via CoverMyMeds. Will update once we receive a response.

## 2021-04-20 LAB — CBC WITH DIFFERENTIAL/PLATELET
Absolute Monocytes: 437 cells/uL (ref 200–950)
Basophils Absolute: 34 cells/uL (ref 0–200)
Basophils Relative: 0.4 %
Eosinophils Absolute: 235 cells/uL (ref 15–500)
Eosinophils Relative: 2.8 %
HCT: 38.1 % (ref 35.0–45.0)
Hemoglobin: 12.4 g/dL (ref 11.7–15.5)
Lymphs Abs: 2654 cells/uL (ref 850–3900)
MCH: 26.6 pg — ABNORMAL LOW (ref 27.0–33.0)
MCHC: 32.5 g/dL (ref 32.0–36.0)
MCV: 81.6 fL (ref 80.0–100.0)
MPV: 11.7 fL (ref 7.5–12.5)
Monocytes Relative: 5.2 %
Neutro Abs: 5040 cells/uL (ref 1500–7800)
Neutrophils Relative %: 60 %
Platelets: 214 10*3/uL (ref 140–400)
RBC: 4.67 10*6/uL (ref 3.80–5.10)
RDW: 13.1 % (ref 11.0–15.0)
Total Lymphocyte: 31.6 %
WBC: 8.4 10*3/uL (ref 3.8–10.8)

## 2021-04-20 LAB — COMPLETE METABOLIC PANEL WITH GFR
AG Ratio: 1.3 (calc) (ref 1.0–2.5)
ALT: 11 U/L (ref 6–29)
AST: 13 U/L (ref 10–30)
Albumin: 3.8 g/dL (ref 3.6–5.1)
Alkaline phosphatase (APISO): 61 U/L (ref 31–125)
BUN: 8 mg/dL (ref 7–25)
CO2: 29 mmol/L (ref 20–32)
Calcium: 9.4 mg/dL (ref 8.6–10.2)
Chloride: 104 mmol/L (ref 98–110)
Creat: 0.64 mg/dL (ref 0.50–0.99)
Globulin: 3 g/dL (calc) (ref 1.9–3.7)
Glucose, Bld: 115 mg/dL — ABNORMAL HIGH (ref 65–99)
Potassium: 4.5 mmol/L (ref 3.5–5.3)
Sodium: 140 mmol/L (ref 135–146)
Total Bilirubin: 0.2 mg/dL (ref 0.2–1.2)
Total Protein: 6.8 g/dL (ref 6.1–8.1)
eGFR: 113 mL/min/{1.73_m2} (ref >=60)

## 2021-04-20 NOTE — Progress Notes (Signed)
CBC is normal.  CMP shows mildly elevated glucose, probably not a fasting sample.

## 2021-04-23 ENCOUNTER — Telehealth: Payer: Self-pay | Admitting: Physical Medicine and Rehabilitation

## 2021-04-23 NOTE — Telephone Encounter (Signed)
Patient called. Returning a call to [place] 

## 2021-04-26 ENCOUNTER — Ambulatory Visit: Payer: Self-pay | Admitting: Rheumatology

## 2021-05-02 ENCOUNTER — Telehealth: Payer: Self-pay | Admitting: Physical Medicine and Rehabilitation

## 2021-05-02 NOTE — Telephone Encounter (Signed)
Pt called to reschedule appt. Please call pt at [phone]

## 2021-05-02 NOTE — Telephone Encounter (Signed)
Called patient and rescheduled appointment

## 2021-05-04 ENCOUNTER — Encounter: Payer: BC Managed Care – PPO | Admitting: Physical Medicine and Rehabilitation

## 2021-05-16 ENCOUNTER — Ambulatory Visit (INDEPENDENT_AMBULATORY_CARE_PROVIDER_SITE_OTHER): Payer: BC Managed Care – PPO | Admitting: Physical Medicine and Rehabilitation

## 2021-05-16 ENCOUNTER — Other Ambulatory Visit: Payer: Self-pay

## 2021-05-16 ENCOUNTER — Encounter: Payer: Self-pay | Admitting: Physical Medicine and Rehabilitation

## 2021-05-16 DIAGNOSIS — M79642 Pain in left hand: Secondary | ICD-10-CM

## 2021-05-16 DIAGNOSIS — R202 Paresthesia of skin: Secondary | ICD-10-CM

## 2021-05-16 DIAGNOSIS — M797 Fibromyalgia: Secondary | ICD-10-CM

## 2021-05-16 DIAGNOSIS — M79641 Pain in right hand: Secondary | ICD-10-CM | POA: Diagnosis not present

## 2021-05-16 DIAGNOSIS — R7303 Prediabetes: Secondary | ICD-10-CM | POA: Diagnosis not present

## 2021-05-16 NOTE — Progress Notes (Signed)
Numbness, tingling, and pain in both hands. Right hand is worse. Difficulty opening jars.  Right hand dominant + Lotion

## 2021-05-18 NOTE — Procedures (Signed)
EMG & NCV Findings: Evaluation of the left median (across palm) sensory and the right median (across palm) sensory nerves showed prolonged distal peak latency (Wrist, L4.1, R3.9 ms).  All remaining nerves (as indicated in the following tables) were within normal limits.  Left vs. Right side comparison data for the ulnar motor nerve indicates abnormal L-R latency difference (0.7 ms) and abnormal L-R velocity difference (B Elbow-Wrist, 16 m/s).  All remaining left vs. right side differences were within normal limits.    All examined muscles (as indicated in the following table) showed no evidence of electrical instability.    Impression: The above electrodiagnostic study is ABNORMAL and reveals evidence of a mild bilateral median nerve entrapment at the wrist (carpal tunnel syndrome) affecting sensory and motor components.   There is no significant electrodiagnostic evidence of any other focal nerve entrapment, brachial plexopathy or cervical radiculopathy. **This electrodiagnostic study cannot rule out small fiber polyneuropathy and dysesthesias from central pain syndromes such as stroke or central pain sensitization syndromes such as fibromyalgia.  Myotomal referral pain from trigger points is also not excluded.  Recommendations: 1.  Follow-up with referring physician. 2.  Continue current management of symptoms.  Consider carpal tunnel injection. 3.  Continue use of resting splint at night-time and as needed during the day.  ___________________________ Elease Hashimoto Board Certified, American Board of Physical Medicine and Rehabilitation    Nerve Conduction Studies Anti Sensory Summary Table   Stim Site NR Peak (ms) Norm Peak (ms) P-T Amp (V) Norm P-T Amp Site1 Site2 Delta-P (ms) Dist (cm) Vel (m/s) Norm Vel (m/s)  Left Median Acr Palm Anti Sensory (2nd Digit)  31.7C  Wrist    *4.1 <3.6 41.1 >10 Wrist Palm 2.2 0.0    Palm    1.9 <2.0 16.5         Right Median Acr Palm Anti Sensory  (2nd Digit)  31.4C  Wrist    *3.9 <3.6 38.0 >10 Wrist Palm 2.1 0.0    Palm    1.8 <2.0 14.9         Left Radial Anti Sensory (Base 1st Digit)  31.2C  Wrist    2.1 <3.1 34.2  Wrist Base 1st Digit 2.1 0.0    Right Radial Anti Sensory (Base 1st Digit)  30.5C  Wrist    2.0 <3.1 43.6  Wrist Base 1st Digit 2.0 0.0    Left Ulnar Anti Sensory (5th Digit)  31.8C  Wrist    3.5 <3.7 25.1 >15.0 Wrist 5th Digit 3.5 14.0 40 >38  Right Ulnar Anti Sensory (5th Digit)  31.1C  Wrist    3.7 <3.7 41.6 >15.0 Wrist 5th Digit 3.7 14.0 38 >38   Motor Summary Table   Stim Site NR Onset (ms) Norm Onset (ms) O-P Amp (mV) Norm O-P Amp Site1 Site2 Delta-0 (ms) Dist (cm) Vel (m/s) Norm Vel (m/s)  Left Median Motor (Abd Poll Brev)  31.2C  Wrist    3.9 <4.2 9.4 >5 Elbow Wrist 3.4 20.5 60 >50  Elbow    7.3  9.7         Right Median Motor (Abd Poll Brev)  30.3C  Wrist    3.8 <4.2 12.2 >5 Elbow Wrist 3.5 20.0 57 >50  Elbow    7.3  7.4         Left Ulnar Motor (Abd Dig Min)  31.2C  Wrist    3.0 <4.2 8.3 >3 B Elbow Wrist 2.9 20.0 69 >53  B Elbow  5.9  9.9  A Elbow B Elbow 1.3 11.0 85 >53  A Elbow    7.2  9.1         Right Ulnar Motor (Abd Dig Min)  30.1C  Wrist    2.3 <4.2 9.6 >3 B Elbow Wrist 3.8 20.0 53 >53  B Elbow    6.1  10.1  A Elbow B Elbow 1.2 10.0 83 >53  A Elbow    7.3  9.8          EMG   Side Muscle Nerve Root Ins Act Fibs Psw Amp Dur Poly Recrt Int Dennie Bible Comment  Right Abd Poll Brev Median C8-T1 Nml Nml Nml Nml Nml 0 Nml Nml   Right 1stDorInt Ulnar C8-T1 Nml Nml Nml Nml Nml 0 Nml Nml   Right PronatorTeres Median C6-7 Nml Nml Nml Nml Nml 0 Nml Nml   Right Biceps Musculocut C5-6 Nml Nml Nml Nml Nml 0 Nml Nml   Right Deltoid Axillary C5-6 Nml Nml Nml Nml Nml 0 Nml Nml     Nerve Conduction Studies Anti Sensory Left/Right Comparison   Stim Site L Lat (ms) R Lat (ms) L-R Lat (ms) L Amp (V) R Amp (V) L-R Amp (%) Site1 Site2 L Vel (m/s) R Vel (m/s) L-R Vel (m/s)  Median Acr Palm Anti Sensory  (2nd Digit)  31.7C  Wrist *4.1 *3.9 0.2 41.1 38.0 7.5 Wrist Palm     Palm 1.9 1.8 0.1 16.5 14.9 9.7       Radial Anti Sensory (Base 1st Digit)  31.2C  Wrist 2.1 2.0 0.1 34.2 43.6 21.6 Wrist Base 1st Digit     Ulnar Anti Sensory (5th Digit)  31.8C  Wrist 3.5 3.7 0.2 25.1 41.6 39.7 Wrist 5th Digit 40 38 2   Motor Left/Right Comparison   Stim Site L Lat (ms) R Lat (ms) L-R Lat (ms) L Amp (mV) R Amp (mV) L-R Amp (%) Site1 Site2 L Vel (m/s) R Vel (m/s) L-R Vel (m/s)  Median Motor (Abd Poll Brev)  31.2C  Wrist 3.9 3.8 0.1 9.4 12.2 23.0 Elbow Wrist 60 57 3  Elbow 7.3 7.3 0.0 9.7 7.4 23.7       Ulnar Motor (Abd Dig Min)  31.2C  Wrist 3.0 2.3 *0.7 8.3 9.6 13.5 B Elbow Wrist 69 53 *16  B Elbow 5.9 6.1 0.2 9.9 10.1 2.0 A Elbow B Elbow 85 83 2  A Elbow 7.2 7.3 0.1 9.1 9.8 7.1          Waveforms:

## 2021-05-23 ENCOUNTER — Encounter: Payer: Self-pay | Admitting: Physical Medicine and Rehabilitation

## 2021-05-23 DIAGNOSIS — Z6839 Body mass index (BMI) 39.0-39.9, adult: Secondary | ICD-10-CM | POA: Insufficient documentation

## 2021-05-23 NOTE — Progress Notes (Signed)
[name] - 40 y.o. female MRN [id number]  Date of birth: 1980/08/10  Office Visit Note: Visit Date: 05/16/2021 PCP: Juluis Rainier, MD Referred by: Juluis Rainier, MD  Subjective: Chief Complaint  Patient presents with   Right Hand - Numbness   Left Hand - Numbness   HPI: [name] is a 40 y.o. female who comes in todayAt the request of Dr. Melvenia Needles for evaluation and management of chronic worsening severe bilateral upper extremity pain with some history of numbness and tingling and generalized pain.  Patient has a long-term history of osteoarthritis and multiple joint pain as well as fibromyalgia and prediabetes.  She reports significant nocturnal pain complaints with a positive flick sign at night.  She has been told that she has had carpal tunnel syndrome.  She has had no prior electrodiagnostic studies.  She has not had any imaging of the hands as far as x-rays etc.  No imaging of the cervical spine.  She does endorse generalized pain and neck pain in general.  She reports the pain in her hands are worse on the right compared to left. She is working as a Building control surveyor now and her symptoms get worse when she is driving.  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  She has not tried physical therapy.  She continues to have some discomfort in her feet.  She does not carry any diagnosis of polyneuropathy.  She also has a history of migraine headaches.  She does not seem to relate the headache timing to symptoms in the hands.  She does get some generalized shoulder pain but again nothing shooting down the arms to the hands.  She has had no balance difficulties.   Review of Systems  Musculoskeletal:  Positive for back pain, joint pain and neck pain.  Neurological:  Positive for tingling and headaches.  All other systems reviewed and are negative. Otherwise per HPI.  Assessment & Plan: Visit Diagnoses:     ICD-10-CM   1. Paresthesia of skin  R20.2 NCV with EMG (electromyography)    2. Bilateral hand pain  M79.641    M79.642     3. Fibromyalgia  M79.7     4. Prediabetes  R73.03        Plan: Impression: The above electrodiagnostic study is ABNORMAL and reveals evidence of a mild bilateral median nerve entrapment at the wrist (carpal tunnel syndrome) affecting sensory and motor components.   There is no significant electrodiagnostic evidence of any other focal nerve entrapment, brachial plexopathy or cervical radiculopathy. **This electrodiagnostic study cannot rule out small fiber polyneuropathy and dysesthesias from central pain syndromes such as stroke or central pain sensitization syndromes such as fibromyalgia.  Myotomal referral pain from trigger points is also not excluded.  Recommendations: 1.  Follow-up with referring physician. 2.  Continue current management of symptoms.  Consider carpal tunnel injection. 3.  Continue use of resting splint at night-time and as needed during the day.  Meds & Orders: No orders of the defined types were placed in this encounter.   Orders Placed This Encounter  Procedures   NCV with EMG (electromyography)    Follow-up: Return for Melvenia Needles, MD.   Procedures: No procedures performed  EMG & NCV Findings: Evaluation of the left median (across palm) sensory and the right median (across palm) sensory nerves showed prolonged distal peak latency (Wrist, L4.1, R3.9 ms).  All remaining nerves (as indicated in the  following tables) were within normal limits.  Left vs. Right side comparison data for the ulnar motor nerve indicates abnormal L-R latency difference (0.7 ms) and abnormal L-R velocity difference (B Elbow-Wrist, 16 m/s).  All remaining left vs. right side differences were within normal limits.    All examined muscles (as indicated in the following table) showed no evidence of electrical instability.    Impression: The above  electrodiagnostic study is ABNORMAL and reveals evidence of a mild bilateral median nerve entrapment at the wrist (carpal tunnel syndrome) affecting sensory and motor components.   There is no significant electrodiagnostic evidence of any other focal nerve entrapment, brachial plexopathy or cervical radiculopathy. **This electrodiagnostic study cannot rule out small fiber polyneuropathy and dysesthesias from central pain syndromes such as stroke or central pain sensitization syndromes such as fibromyalgia.  Myotomal referral pain from trigger points is also not excluded.  Recommendations: 1.  Follow-up with referring physician. 2.  Continue current management of symptoms.  Consider carpal tunnel injection. 3.  Continue use of resting splint at night-time and as needed during the day.  ___________________________ Elease Hashimoto Board Certified, American Board of Physical Medicine and Rehabilitation    Nerve Conduction Studies Anti Sensory Summary Table   Stim Site NR Peak (ms) Norm Peak (ms) P-T Amp (V) Norm P-T Amp Site1 Site2 Delta-P (ms) Dist (cm) Vel (m/s) Norm Vel (m/s)  Left Median Acr Palm Anti Sensory (2nd Digit)  31.7C  Wrist    *4.1 <3.6 41.1 >10 Wrist Palm 2.2 0.0    Palm    1.9 <2.0 16.5         Right Median Acr Palm Anti Sensory (2nd Digit)  31.4C  Wrist    *3.9 <3.6 38.0 >10 Wrist Palm 2.1 0.0    Palm    1.8 <2.0 14.9         Left Radial Anti Sensory (Base 1st Digit)  31.2C  Wrist    2.1 <3.1 34.2  Wrist Base 1st Digit 2.1 0.0    Right Radial Anti Sensory (Base 1st Digit)  30.5C  Wrist    2.0 <3.1 43.6  Wrist Base 1st Digit 2.0 0.0    Left Ulnar Anti Sensory (5th Digit)  31.8C  Wrist    3.5 <3.7 25.1 >15.0 Wrist 5th Digit 3.5 14.0 40 >38  Right Ulnar Anti Sensory (5th Digit)  31.1C  Wrist    3.7 <3.7 41.6 >15.0 Wrist 5th Digit 3.7 14.0 38 >38   Motor Summary Table   Stim Site NR Onset (ms) Norm Onset (ms) O-P Amp (mV) Norm O-P Amp Site1 Site2 Delta-0 (ms)  Dist (cm) Vel (m/s) Norm Vel (m/s)  Left Median Motor (Abd Poll Brev)  31.2C  Wrist    3.9 <4.2 9.4 >5 Elbow Wrist 3.4 20.5 60 >50  Elbow    7.3  9.7         Right Median Motor (Abd Poll Brev)  30.3C  Wrist    3.8 <4.2 12.2 >5 Elbow Wrist 3.5 20.0 57 >50  Elbow    7.3  7.4         Left Ulnar Motor (Abd Dig Min)  31.2C  Wrist    3.0 <4.2 8.3 >3 B Elbow Wrist 2.9 20.0 69 >53  B Elbow    5.9  9.9  A Elbow B Elbow 1.3 11.0 85 >53  A Elbow    7.2  9.1         Right Ulnar Motor (Abd Dig  Min)  30.1C  Wrist    2.3 <4.2 9.6 >3 B Elbow Wrist 3.8 20.0 53 >53  B Elbow    6.1  10.1  A Elbow B Elbow 1.2 10.0 83 >53  A Elbow    7.3  9.8          EMG   Side Muscle Nerve Root Ins Act Fibs Psw Amp Dur Poly Recrt Int Dennie Bible Comment  Right Abd Poll Brev Median C8-T1 Nml Nml Nml Nml Nml 0 Nml Nml   Right 1stDorInt Ulnar C8-T1 Nml Nml Nml Nml Nml 0 Nml Nml   Right PronatorTeres Median C6-7 Nml Nml Nml Nml Nml 0 Nml Nml   Right Biceps Musculocut C5-6 Nml Nml Nml Nml Nml 0 Nml Nml   Right Deltoid Axillary C5-6 Nml Nml Nml Nml Nml 0 Nml Nml     Nerve Conduction Studies Anti Sensory Left/Right Comparison   Stim Site L Lat (ms) R Lat (ms) L-R Lat (ms) L Amp (V) R Amp (V) L-R Amp (%) Site1 Site2 L Vel (m/s) R Vel (m/s) L-R Vel (m/s)  Median Acr Palm Anti Sensory (2nd Digit)  31.7C  Wrist *4.1 *3.9 0.2 41.1 38.0 7.5 Wrist Palm     Palm 1.9 1.8 0.1 16.5 14.9 9.7       Radial Anti Sensory (Base 1st Digit)  31.2C  Wrist 2.1 2.0 0.1 34.2 43.6 21.6 Wrist Base 1st Digit     Ulnar Anti Sensory (5th Digit)  31.8C  Wrist 3.5 3.7 0.2 25.1 41.6 39.7 Wrist 5th Digit 40 38 2   Motor Left/Right Comparison   Stim Site L Lat (ms) R Lat (ms) L-R Lat (ms) L Amp (mV) R Amp (mV) L-R Amp (%) Site1 Site2 L Vel (m/s) R Vel (m/s) L-R Vel (m/s)  Median Motor (Abd Poll Brev)  31.2C  Wrist 3.9 3.8 0.1 9.4 12.2 23.0 Elbow Wrist 60 57 3  Elbow 7.3 7.3 0.0 9.7 7.4 23.7       Ulnar Motor (Abd Dig Min)  31.2C  Wrist 3.0 2.3  *0.7 8.3 9.6 13.5 B Elbow Wrist 69 53 *16  B Elbow 5.9 6.1 0.2 9.9 10.1 2.0 A Elbow B Elbow 85 83 2  A Elbow 7.2 7.3 0.1 9.1 9.8 7.1          Waveforms:                     Clinical History: No specialty comments available.   She reports that she has never smoked. She has never used smokeless tobacco. No results for input(s): HGBA1C, LABURIC in the last 8760 hours.  Objective:  VS:  HT:    WT:   BMI:     BP:   HR: bpm  TEMP: ( )  RESP:  Physical Exam Vitals and nursing note reviewed.  Constitutional:      General: She is not in acute distress.    Appearance: Normal appearance. She is not ill-appearing.  HENT:     Head: Normocephalic and atraumatic.     Right Ear: External ear normal.     Left Ear: External ear normal.  Eyes:     Extraocular Movements: Extraocular movements intact.  Cardiovascular:     Rate and Rhythm: Normal rate.     Pulses: Normal pulses.  Musculoskeletal:        General: Tenderness present. No swelling or deformity.     Cervical back: Neck supple. Tenderness present. No rigidity.  Right lower leg: No edema.     Left lower leg: No edema.     Comments: Patient has good strength in the upper extremities including 5 out of 5 strength in wrist extension long finger flexion and APB.  There is no atrophy of the hands intrinsically.  There is a negative Hoffmann's test.  Sensation is intact to light touch in all dermatomes and peripheral nerve distributions of both hands.  She does have mild shoulder impingement right more than left.  She has no synovitis noted in the hands.  Some pain over the [facility] joint on the right.  She has a positive Phalen's test on the right compared to left.  She does not have any allodynia or swelling or discoloration.   Lymphadenopathy:     Cervical: No cervical adenopathy.  Skin:    General: Skin is warm and dry.     Findings: No erythema, lesion or rash.  Neurological:     General: No focal deficit present.     Mental  Status: She is alert and oriented to person, place, and time.     Sensory: No sensory deficit.     Motor: No weakness or abnormal muscle tone.     Coordination: Coordination normal.     Gait: Gait normal.  Psychiatric:        Mood and Affect: Mood normal.        Behavior: Behavior normal.    Ortho Exam  Imaging: No results found.  Past Medical/Family/Surgical/Social History: Medications & Allergies reviewed per EMR, new medications updated. Patient Active Problem List   Diagnosis Date Noted   Body mass index (BMI) 39.0-39.9, adult 05/23/2021   Fibromyalgia 05/03/2020   Chondromalacia of right patella 05/03/2020   Family history of systemic lupus erythematosus 09/16/2018   Hx of migraines 08/21/2018   Prediabetes 08/21/2018   Vitamin D deficiency 08/21/2018   History of carpal tunnel syndrome 08/21/2018   Past Medical History:  Diagnosis Date   Healthy adult    Migraine    Family History  Problem Relation Age of Onset   Hypertension Mother    Diabetes Mother    Asthma Mother    Asthma Father    Heart attack Father    Hypertension Brother    Heart attack Brother    Asthma Brother    Healthy Son    Healthy Daughter    Past Surgical History:  Procedure Laterality Date   ABLATION     CHOLECYSTECTOMY     TONSILLECTOMY     TUBAL LIGATION     Social History   Occupational History   Occupation: Schools  Tobacco Use   Smoking status: Never   Smokeless tobacco: Never  Vaping Use   Vaping Use: Never used  Substance and Sexual Activity   Alcohol use: Yes    Comment: occ   Drug use: Never   Sexual activity: Not on file

## 2021-05-24 ENCOUNTER — Telehealth: Payer: Self-pay | Admitting: *Deleted

## 2021-05-24 NOTE — Telephone Encounter (Signed)
Patient advised Nerve conduction velocity showed bilateral mild carpal tunnel syndrome.  Patient may use bilateral carpal tunnel braces at night.  If she would prefer to have cortisone injection for the carpal tunnel syndrome we can schedule ultrasound-guided cortisone injection for the carpal tunnel syndrome. Patient states she will try the braces for now and will call back if she changes her mind about the injections.

## 2021-05-24 NOTE — Telephone Encounter (Signed)
Attempted to contact the patient and left message for patient to call the office.  

## 2021-05-24 NOTE — Telephone Encounter (Signed)
-----   Message from Pollyann Savoy, MD sent at 05/23/2021  4:37 PM EST ----- Nerve conduction velocity showed bilateral mild carpal tunnel syndrome.  Patient may use bilateral carpal tunnel braces at night.  If she would prefer to have cortisone injection for the carpal tunnel syndrome we can schedule ultrasound-guided cortisone injection for the carpal tunnel syndrome. Pollyann Savoy, MD  ----- Message ----- From: Tyrell Antonio, MD Sent: 05/23/2021   5:32 AM EST To: Pollyann Savoy, MD, Gearldine Bienenstock, PA-C

## 2021-06-14 ENCOUNTER — Telehealth: Payer: Self-pay

## 2021-06-14 NOTE — Telephone Encounter (Signed)
Patient advised referral for PT has been reopened. Patient advised she may call to schedule her appointment.

## 2021-06-14 NOTE — Telephone Encounter (Signed)
Patient called stating [facility] closed her physical therapy referral.  Patient states she is ready to schedule and requesting a new referral be sent to their office.
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
## 2021-07-10 ENCOUNTER — Other Ambulatory Visit: Payer: Self-pay

## 2021-07-10 ENCOUNTER — Encounter: Payer: Self-pay | Admitting: Physical Therapy

## 2021-07-10 ENCOUNTER — Ambulatory Visit: Payer: BC Managed Care – PPO | Attending: Rheumatology | Admitting: Physical Therapy

## 2021-07-10 DIAGNOSIS — M79641 Pain in right hand: Secondary | ICD-10-CM | POA: Insufficient documentation

## 2021-07-10 DIAGNOSIS — M79642 Pain in left hand: Secondary | ICD-10-CM | POA: Insufficient documentation

## 2021-07-10 DIAGNOSIS — G8929 Other chronic pain: Secondary | ICD-10-CM | POA: Insufficient documentation

## 2021-07-10 DIAGNOSIS — M2241 Chondromalacia patellae, right knee: Secondary | ICD-10-CM | POA: Insufficient documentation

## 2021-07-10 DIAGNOSIS — M25561 Pain in right knee: Secondary | ICD-10-CM | POA: Insufficient documentation

## 2021-07-10 DIAGNOSIS — M6281 Muscle weakness (generalized): Secondary | ICD-10-CM | POA: Diagnosis present

## 2021-07-10 NOTE — Patient Instructions (Signed)
Access Code: [email or web address] URL: [email or web address] Date: 07/10/2021 Prepared by: Rosana Hoes  Exercises Active Straight Leg Raise with Quad Set - 1 x daily - 7 x weekly - 2 sets - 10 reps Sidelying Hip Abduction - 1 x daily - 7 x weekly - 2 sets - 10 reps Bridge - 1 x daily - 7 x weekly - 2 sets - 10 reps - 3 hold Sit to Stand Without Arm Support - 1 x daily - 7 x weekly - 2 sets - 10 reps Putty Squeezes - 1 x daily - 7 x weekly - 2 sets - 10 reps

## 2021-07-17 NOTE — Progress Notes (Signed)
Office Visit Note  Patient: [name]             Date of Birth: 1981-06-12           MRN: [id number]             PCP: Marda Stalker, PA-C Referring: Marda Stalker, PA-C Visit Date: 07/18/2021 Occupation: @GUAROCC @  Subjective:  Low back pain   History of Present Illness: [name] is a 41 y.o. female with history of fibromyalgia.  She remains on Cymbalta 60 mg 1 capsule daily and takes methocarbamol 500 mg 1 tablet twice daily as needed for muscle spasms.  Patient reports that in mid December she was diagnosed with a sinus infection upper respiratory tract infection.  She states that during that time she was more sedentary leading to a fibromyalgia flare.  She was experiencing generalized myalgias and muscle tenderness as well as increased lower back pain since she was having to lay around while off of work.  She states that her lower back discomfort started on 07/04/2021 and gradually improved until 07/14/2021 when she was putting a jug of milk in the fridge and felt a pop in her lower back.  She states that she fell to the floor due to severity of pain and had difficulty getting up without her daughter's assistance.  She states the pain initially was on the right side of her lower back but has since moved to the left side.  She denies any muscle spasms.  She denies any numbness or tingling in her legs.  She has not had any muscle weakness in her lower extremities.  She denies any fevers chills, or bowel or bladder incontinence.  Her pain is most severe when changing positions especially when bending.  She describes the pain as a sharp shooting pain with changes in position.  She has tried using Voltaren gel, Tiger balm, and applying heat with no pain relief.  She has also tried taking methocarbamol but it has not been alleviating her symptoms.  She states that in the past she fractured her tailbone but this pain is significantly more severe. She states that last week she started  physical therapy for her right knee joint but has been unable to perform exercises due to the severity of discomfort in her lower back.  She states that overall her right knee joint pain has improved since having a cortisone injection on 04/19/2021.   Activities of Daily Living:  Patient reports morning stiffness for all day. Patient Reports nocturnal pain.  Difficulty dressing/grooming: Reports Difficulty climbing stairs: Reports Difficulty getting out of chair: Reports Difficulty using hands for taps, buttons, cutlery, and/or writing: Reports  Review of Systems  Constitutional:  Positive for fatigue.  HENT:  Negative for mouth sores, mouth dryness and nose dryness.   Eyes:  Positive for dryness. Negative for pain and itching.  Respiratory:  Negative for shortness of breath and difficulty breathing.   Cardiovascular:  Negative for chest pain and palpitations.  Gastrointestinal:  Negative for blood in stool, constipation and diarrhea.  Endocrine: Negative for increased urination.  Genitourinary:  Negative for difficulty urinating.  Musculoskeletal:  Positive for joint pain, joint pain, myalgias, muscle weakness, morning stiffness, muscle tenderness and myalgias.  Skin:  Negative for color change, rash and redness.  Allergic/Immunologic: Negative for susceptible to infections.  Neurological:  Negative for dizziness, numbness, headaches and memory loss.  Hematological:  Negative for bruising/bleeding tendency.  Psychiatric/Behavioral:  Negative for confusion.    PMFS History:  Patient Active Problem List   Diagnosis Date Noted   Body mass index (BMI) 39.0-39.9, adult 05/23/2021   Fibromyalgia 05/03/2020   Chondromalacia of right patella 05/03/2020   Family history of systemic lupus erythematosus 09/16/2018   Hx of migraines 08/21/2018   Prediabetes 08/21/2018   Vitamin D deficiency 08/21/2018   History of carpal tunnel syndrome 08/21/2018    Past Medical History:  Diagnosis Date    Healthy adult    Migraine     Family History  Problem Relation Age of Onset   Hypertension Mother    Diabetes Mother    Asthma Mother    Asthma Father    Heart attack Father    Hypertension Brother    Heart attack Brother    Asthma Brother    Healthy Son    Healthy Daughter    Past Surgical History:  Procedure Laterality Date   ABLATION     CHOLECYSTECTOMY     TONSILLECTOMY     TUBAL LIGATION     Social History   Social History Narrative   Lives with two children   Drinks caffeine every once in a while    Immunization History  Administered Date(s) Administered   Hepatitis B, adult 12/11/2017, 01/11/2018, 06/17/2018   Influenza Split 04/20/2013   PPD Test 12/11/2017   Tdap 10/01/2007, 09/22/2017     Objective: Vital Signs: BP 140/82 (BP Location: Left Arm, Patient Position: Sitting, Cuff Size: Large)    Pulse (!) 103    Ht 5' 4.5" (1.638 m)    Wt 225 lb 6.4 oz (102.2 kg)    BMI 38.09 kg/m    Physical Exam Vitals and nursing note reviewed.  Constitutional:      Appearance: She is well-developed.  HENT:     Head: Normocephalic and atraumatic.  Eyes:     Conjunctiva/sclera: Conjunctivae normal.  Pulmonary:     Effort: Pulmonary effort is normal.  Abdominal:     Palpations: Abdomen is soft.  Musculoskeletal:     Cervical back: Normal range of motion.  Skin:    General: Skin is warm and dry.     Capillary Refill: Capillary refill takes less than 2 seconds.  Neurological:     Mental Status: She is alert and oriented to person, place, and time.  Psychiatric:        Behavior: Behavior normal.     Musculoskeletal Exam: C-spine has good ROM.  Painful and limited ROM of the lumbar spine.  Midline spinal tenderness in the lumbar region and over the left SI joint.  Shoulder joints, elbow joints, wrist joints, MCPs, PIPs, and DIPs good ROM with no synovitis.  Complete fist formation bilaterally.  Hip joints difficult to assess due to severity of pain in her lower  back.  Knee joints have good ROM with no warmth or effusion.  Ankle joints have good ROM with no tenderness or joint swelling.   CDAI Exam: CDAI Score: -- Patient Global: --; Provider Global: -- Swollen: --; Tender: -- Joint Exam 07/18/2021   No joint exam has been documented for this visit   There is currently no information documented on the homunculus. Go to the Rheumatology activity and complete the homunculus joint exam.  Investigation: No additional findings.  Imaging: No results found.  Recent Labs: Lab Results  Component Value Date   WBC 8.4 04/19/2021   HGB 12.4 04/19/2021   PLT 214 04/19/2021   NA 140 04/19/2021   K 4.5 04/19/2021   CL 104  04/19/2021   CO2 29 04/19/2021   GLUCOSE 115 (H) 04/19/2021   BUN 8 04/19/2021   CREATININE 0.64 04/19/2021   BILITOT 0.2 04/19/2021   ALKPHOS 55 09/25/2014   AST 13 04/19/2021   ALT 11 04/19/2021   PROT 6.8 04/19/2021   ALBUMIN 3.5 09/25/2014   CALCIUM 9.4 04/19/2021   GFRAA 131 01/07/2020    Speciality Comments: No specialty comments available.  Procedures:  No procedures performed Allergies: Pineapple, Tinidazole, Amoxicillin-pot clavulanate, Other, and Biaxin [clarithromycin]      Assessment / Plan:     Visit Diagnoses: Fibromyalgia - She had a recent fibromyalgia flare starting in mid December 2022 after being diagnosed with a sinus infection.  While recovering from the upper respiratory tract infection she was more sedentary than usual which she feels led to this flare.  At that time she was experiencing increased myalgias and muscle tenderness.  She also was having increased lower back pain which she attributed to sitting and lying for prolonged periods of time.  She remains on Cymbalta 60 mg 1 tab by mouth daily and Robaxin 500 mg twice daily as needed for muscle spasms.  She continues to have persistent lower back pain which is exacerbated on 07/14/2021 when putting a jug of milk in the fridge.  Overall her  generalized pain from fibromyalgia has improved.  Discussed the importance of regular exercise and good sleep hygiene to help prevent flares.  Also discussed the importance of stress management.  She will remain on the current treatment regimen. A refill of methocarbamol and Cymbalta were sent to the pharmacy today.  She will continue physical therapy as advised.  She will follow-up in the office in 6 months.  Acute left-sided low back pain with left-sided sciatica -She presents today with increased lower back pain which inititally started on 07/04/21 while having to be more sedentary while recovering from an upper respiratory tract infection.  She did not have any injury prior to the onset of symptoms.  On 07/14/2021 her discomfort was exacerbated after putting a nail jug in the fridge at which time she felt a pop in her lower back.  The pain was so severe that she fell to the floor and had difficulty rising with assistance from her daughter.  She has had constant pain since then.  She has difficulty walking, laying, and sitting.  Her pain is most severe with forward flexion.  Most of her discomfort is left-sided.  She has not had any muscle spasms.  No bowel or bladder incontinence.  No recent fevers or chills.  She has not had any lower extremity weakness or numbness and tingling in her legs.  On examination today she has some midline spinal tenderness in the lumbar region as well as tenderness over the left SI joint.  No lower extremity muscle weakness was noted. X-rays of the lumbar spine and pelvis were obtained today for further evaluation.  Results were reviewed with the patient today in the office.  Different treatment options were discussed today in detail.  Referral to a back specialist will be placed today for further evaluation and management if her symptoms persist or worsen.  The order for physical therapy will also be changed to include management of her lower back pain.  A prednisone taper  starting at 20 mg tapering by 5 mg every 2 days was sent to the pharmacy along with a refill of methocarbamol.  I also applied a Salonpas patch to the patient's lower back to  provide some immediate pain relief.  A work note was also provided for the patient so she does not have to return to work today.  She was advised to notify us if her symptoms persist or worsen.  Plan: XR Lumbar Spine 2-3 Views, XR Pelvis 1-2 Views  History of carpal tunnel syndrome - Bilateral-mild median nerve entrapment at wrist revealed on NCV with EMG on 05/16/21.   Chondromalacia of right patella - She had a right knee joint cortisone injection performed on 04/19/21.  She noticed improvement after the cortisone injection.  She has good range of motion of the right knee joint on examination today with crepitus but no warmth or effusion noted.  She started physical therapy last week.  Pain in both feet - X-rays of both feet obtained on 01/28/19 by Dr. Erlinda Hong.  She is not having any increased discomfort in her feet at this time.  Positive ANA (antinuclear antibody) - ANA 1:320 Nuclear, dense fine speckled, Anti-CCP 37:  Smith negative and dsDNA negative on 01/07/20.  She has no clinical features of systemic lupus at this time.  Other medical conditions are listed as follows:   Prediabetes  Hx of migraines  Vitamin D deficiency: She is taking vitamin D 5000 units daily.   Family history of systemic lupus erythematosus   Orders: Orders Placed This Encounter  Procedures   XR Lumbar Spine 2-3 Views   XR Pelvis 1-2 Views   Meds ordered this encounter  Medications   DULoxetine (CYMBALTA) 60 MG capsule    Sig: TAKE 1 CAPSULE BY MOUTH EVERY DAY    Dispense:  90 capsule    Refill:  0   methocarbamol (ROBAXIN) 500 MG tablet    Sig: Take 1 tablet (500 mg total) by mouth 2 (two) times daily as needed for muscle spasms.    Dispense:  60 tablet    Refill:  0   predniSONE (DELTASONE) 5 MG tablet    Sig: Take 4 tablets by mouth  daily x2 days, 3 tablets daily x2 days, 2 tablets daily x2 days, 1 tablet daily x2 days.    Dispense:  20 tablet    Refill:  0      Follow-Up Instructions: Return in about 6 months (around 01/15/2022) for Fibromyalgia.   Ofilia Neas, PA-C  Note - This record has been created using Dragon software.  Chart creation errors have been sought, but may not always  have been located. Such creation errors do not reflect on  the standard of medical care.

## 2021-07-18 ENCOUNTER — Ambulatory Visit: Payer: BC Managed Care – PPO | Admitting: Physician Assistant

## 2021-07-18 ENCOUNTER — Ambulatory Visit: Payer: Self-pay

## 2021-07-18 ENCOUNTER — Encounter: Payer: Self-pay | Admitting: Physician Assistant

## 2021-07-18 ENCOUNTER — Encounter: Payer: Self-pay | Admitting: *Deleted

## 2021-07-18 ENCOUNTER — Telehealth: Payer: Self-pay

## 2021-07-18 ENCOUNTER — Other Ambulatory Visit: Payer: Self-pay

## 2021-07-18 VITALS — BP 140/82 | HR 103 | Ht 64.5 in | Wt 225.4 lb

## 2021-07-18 DIAGNOSIS — M797 Fibromyalgia: Secondary | ICD-10-CM | POA: Diagnosis not present

## 2021-07-18 DIAGNOSIS — R7303 Prediabetes: Secondary | ICD-10-CM

## 2021-07-18 DIAGNOSIS — R202 Paresthesia of skin: Secondary | ICD-10-CM

## 2021-07-18 DIAGNOSIS — M79672 Pain in left foot: Secondary | ICD-10-CM

## 2021-07-18 DIAGNOSIS — M5442 Lumbago with sciatica, left side: Secondary | ICD-10-CM | POA: Diagnosis not present

## 2021-07-18 DIAGNOSIS — Z5181 Encounter for therapeutic drug level monitoring: Secondary | ICD-10-CM

## 2021-07-18 DIAGNOSIS — M79671 Pain in right foot: Secondary | ICD-10-CM | POA: Diagnosis not present

## 2021-07-18 DIAGNOSIS — Z8269 Family history of other diseases of the musculoskeletal system and connective tissue: Secondary | ICD-10-CM

## 2021-07-18 DIAGNOSIS — M2241 Chondromalacia patellae, right knee: Secondary | ICD-10-CM | POA: Diagnosis not present

## 2021-07-18 DIAGNOSIS — Z8669 Personal history of other diseases of the nervous system and sense organs: Secondary | ICD-10-CM | POA: Diagnosis not present

## 2021-07-18 DIAGNOSIS — E559 Vitamin D deficiency, unspecified: Secondary | ICD-10-CM

## 2021-07-18 DIAGNOSIS — M79641 Pain in right hand: Secondary | ICD-10-CM

## 2021-07-18 DIAGNOSIS — R768 Other specified abnormal immunological findings in serum: Secondary | ICD-10-CM

## 2021-07-18 MED ORDER — PREDNISONE 5 MG PO TABS: ORAL_TABLET | ORAL | 0 refills | Status: DC

## 2021-07-18 MED ORDER — DULOXETINE HCL 60 MG PO CPEP: ORAL_CAPSULE | ORAL | 0 refills | Status: DC

## 2021-07-18 MED ORDER — METHOCARBAMOL 500 MG PO TABS: 500.0000 mg | ORAL_TABLET | Freq: Two times a day (BID) | ORAL | 0 refills | Status: DC | PRN

## 2021-07-18 NOTE — Telephone Encounter (Signed)
Attempted to contact patient and left message on machine to advise patient to call and schedule a 2 month follow up, per Dr. Corliss Skains.

## 2021-07-18 NOTE — Addendum Note (Signed)
Addended by: Ellen Henri on: 07/18/2021 04:15 PM   Modules accepted: Orders

## 2021-07-19 ENCOUNTER — Ambulatory Visit: Payer: BC Managed Care – PPO | Admitting: Physical Therapy

## 2021-07-23 ENCOUNTER — Other Ambulatory Visit: Payer: Self-pay

## 2021-07-23 ENCOUNTER — Ambulatory Visit: Payer: BC Managed Care – PPO | Attending: Rheumatology | Admitting: Physical Therapy

## 2021-07-23 ENCOUNTER — Encounter: Payer: Self-pay | Admitting: Physical Therapy

## 2021-07-23 DIAGNOSIS — M25561 Pain in right knee: Secondary | ICD-10-CM | POA: Diagnosis present

## 2021-07-23 DIAGNOSIS — M79641 Pain in right hand: Secondary | ICD-10-CM | POA: Diagnosis present

## 2021-07-23 DIAGNOSIS — M79642 Pain in left hand: Secondary | ICD-10-CM | POA: Insufficient documentation

## 2021-07-23 DIAGNOSIS — M545 Low back pain, unspecified: Secondary | ICD-10-CM | POA: Insufficient documentation

## 2021-07-23 DIAGNOSIS — G8929 Other chronic pain: Secondary | ICD-10-CM | POA: Insufficient documentation

## 2021-07-23 DIAGNOSIS — M6281 Muscle weakness (generalized): Secondary | ICD-10-CM | POA: Insufficient documentation

## 2021-07-23 NOTE — Therapy (Signed)
OUTPATIENT PHYSICAL THERAPY RE-EVALUATION AND TREATMENT NOTE   Patient Name: [name] MRN: [id number] DOB:Mar 05, 1981, 41 y.o., female Today's Date: 07/23/2021  PCP: Jarrett Soho, PA-C REFERRING PROVIDER: Pollyann Savoy, MD   PT End of Session - 07/23/21 [phone]     Visit Number 2    Number of Visits 8    Date for PT Re-Evaluation 09/04/21    Authorization Type [employer]    PT Start Time [phone]    PT Stop Time 1000    PT Time Calculation (min) 42 min    Activity Tolerance Patient tolerated treatment well    Behavior During Therapy WFL for tasks assessed/performed             Past Medical History:  Diagnosis Date   Healthy adult    Migraine    Past Surgical History:  Procedure Laterality Date   ABLATION     CHOLECYSTECTOMY     TONSILLECTOMY     TUBAL LIGATION     Patient Active Problem List   Diagnosis Date Noted   Body mass index (BMI) 39.0-39.9, adult 05/23/2021   Fibromyalgia 05/03/2020   Chondromalacia of right patella 05/03/2020   Family history of systemic lupus erythematosus 09/16/2018   Hx of migraines 08/21/2018   Prediabetes 08/21/2018   Vitamin D deficiency 08/21/2018   History of carpal tunnel syndrome 08/21/2018    REFERRING PROVIDER: Pollyann Savoy, MD   REFERRING DIAG: Chondromalacia of right patella, Pain in both hands  THERAPY DIAG:  Acute left-sided low back pain, unspecified whether sciatica present  Chronic pain of right knee  Pain in left hand  Pain in right hand  Muscle weakness (generalized)  PERTINENT HISTORY: Fibromyalgia  PRECAUTIONS: None WEIGHT BEARING RESTRICTIONS: No  SUBJECTIVE: Patient reports acute onset of low back pain on 07/14/2021, and she has seen the doctor who updated her PT referral to include her lower back for therapy. She has been on prednisone which has helped. She has had occasional low back pain before, but is was just aches and not like this current episode. She does report knee is feeling  better. She goes to see the back specialist on 07/25/2021 and she will get an MRI ordered.  PAIN:  Are you having pain? Yes VAS scale: 2/10 (10/10 at worst) Pain location: lower back Pain orientation: lower, left PAIN TYPE: Acute Pain description: "feels like something is broke"  Aggravating factors: bending, standing long period Relieving factors: rest, medication  Are you having pain? No VAS scale: 0/10 (8/10 at worst) Pain location: right knee Pain orientation: Right  PAIN TYPE: Chronic Pain description: aching  Aggravating factors: walking and standing extended periods, stairs, driving, squatting Relieving factors: rest, medication   OBJECTIVE: (BOLDED MEASURES ASSESSED THIS VISIT) PATIENT SURVEYS:  FOTO 51% functional status   LE AROM/PROM:   A/PROM Right 07/10/2021 Left 07/10/2021  Knee flexion 120 122  Knee extension 0 0  Ankle dorsiflexion 5 8   (Blank rows = not tested)   LE MMT:   MMT Right 07/10/2021 Left 07/10/2021  Hip flexion 4 4  Hip extension 4- 4-  Hip abduction 4- 4-  Knee flexion 5 5  Knee extension 4+ 5  Grip 37, 30, 30 75, 75, 75   (Blank rows = not tested)  SENSATION:  Light touch: Appears intact  MUSCLE LENGTH:  Grossly WFL  PALPATION: Tender to palpation of left lumbar paraspinals  LUMBAR AROM  A/PROM A/PROM  07/23/2021  Flexion 75% - fingertips to mid shin, difficulty rising  from bent position  [facility]  Right lateral flexion WFL  Left lateral flexion WFL  Right rotation [facility]  Left rotation 75%   (Blank rows = not tested)  LE PROM:  Hip PROM grossly WFL, low back pain noted with all movements  STRENGTH:   Core strength grossly 4-/5 MMT with increase low back pain upon testing    TODAY'S TREATMENT:  07/23/2021: NuStep L4 x 6 min with UE/LE while taking subjective Supine SKTC stretch 2 x 30 sec each LTR 5 x 5 sec each PPT 2 x 5 with 5 sec hold SLR 2 x 5 each, partial range with focus on core  activation Sidelying hip abduction 2 x 10 each Sit to stand 2 x 5 without UE support   07/10/2021: SLR x 10 Sidelying hip abduction x 10 Bridge 10 x 3 sec hold Sit to stand x 10 without UE support     PATIENT EDUCATION:  Education details: POC and HEP update Person educated: Patient Education method: Programmer, multimediaxplanation, Demonstration, Tactile cues, Verbal cues, and Handouts Education comprehension: verbalized understanding, returned demonstration, verbal cues required, tactile cues required, and needs further education   HOME EXERCISE PROGRAM: Access Code: [email or web address]     ASSESSMENT: CLINICAL IMPRESSION: Patient tolerated therapy well with no adverse effects. Re-evaluation was performed this visit to include treatment for acute onset low back pain into the therapy plan of care. She does have history of chronic low back pain but this episode is acute and separate from previous back pain. She does not demonstrate any radicular symptoms this visit but does exhibit slight limitation in lumbar motion and gross strength deficit so updated her HEP to include stretching/mobility and core exercises with good tolerance. Patient would benefit from continued skilled PT to progress mobility and strength in order to reduce pain and maximize functional ability.     GOALS: Goals reviewed with patient? Yes   SHORT TERM GOALS:   STG Name Target Date Goal status  1 Patient will be I with initial HEP in order to progress with therapy. Baseline: updated HEP 08/07/2021 ONGOING  2 PT will review FOTO with patient by 3rd visit in order to understand expected progress and outcome with therapy. Baseline: assessed at eval 08/07/2021 INITIAL  3 Patient will demonstrate proper squat mechanics with </= 4/10 pain to improve household tasks. Baseline: squat deviations with up to 8/10 pain 08/07/2021 INITIAL    LONG TERM GOALS:    LTG Name Target Date Goal status  1 Patient will be I with final HEP to maintain progress  from PT. Baseline: 09/04/2021 INITIAL  2 Patient will report >/= 60% status on FOTO to indicate improved functional ability. Baseline: 51% functional status 09/04/2021 INITIAL  3 Patient will demonstrate 5/5 right knee strength and >/= 4/5 MMT hip strength to improve stair negotiation Baseline: 4+/5 quad strength, 4-/5 hip strength 09/04/2021 INITIAL  4 Patient will demonstrate right grip strength >/= 50 lbs to improve opening jars Baseline: 32 lbs 09/04/2021 INITIAL  5 Patient will report right knee pain </= 2/10 with activities such as driving to improve work ability Baseline: up to 8/10 pain 09/04/2021 INITIAL  6 Patient will demonstrate lumbar AROM grossly WFL to improve mobility and household tasks Baseline: limitation with lumbar flexion and rotation 09/04/2021 INITIAL    PLAN: PT FREQUENCY: 1x/week   PT DURATION: 8 weeks   PLANNED INTERVENTIONS: Therapeutic exercises, Therapeutic activity, Neuro Muscular re-education, Balance training, Gait training, Patient/Family education, Joint mobilization, Stair training, Aquatic Therapy, Dry  Needling, Electrical stimulation, Spinal mobilization, Cryotherapy, Moist heat, Taping, Traction, Ultrasound, Ionotophoresis 4mg /ml Dexamethasone, and Manual therapy   PLAN FOR NEXT SESSION: Review HEP and progress PRN, further assessment of bilateral carpal tunnel (strength, motion, cervical, neurodynamics), progress right quad strength, hip strengthening, step ups, squats, core strengthening and lumbar mobility      , PT, DPT, LAT, ATC 07/23/21  10:39 AM Phone: [phone] Fax: [phone]

## 2021-07-23 NOTE — Patient Instructions (Signed)
Access Code: [email or web address] URL: [email or web address] Date: 07/23/2021 Prepared by: Rosana Hoes  Exercises Supine Posterior Pelvic Tilt - 2-3 x daily - 7 x weekly - 10 reps - 5 seconds hold Hooklying Single Knee to Chest Stretch - 2-3 x daily - 7 x weekly - 2 reps - 30 seconds hold Supine Lower Trunk Rotation - 2-3 x daily - 7 x weekly - 10 reps - 5 seconds hold Small Range Straight Leg Raise - 1-2 x daily - 7 x weekly - 2 sets - 5 reps Sidelying Hip Abduction - 1-2 x daily - 7 x weekly - 2 sets - 10 reps Sit to Stand Without Arm Support - 1-2 x daily - 7 x weekly - 2 sets - 5 reps Putty Squeezes - 1 x daily - 7 x weekly - 2 sets - 10 reps

## 2021-07-25 ENCOUNTER — Encounter: Payer: Self-pay | Admitting: Surgery

## 2021-07-25 ENCOUNTER — Other Ambulatory Visit: Payer: Self-pay

## 2021-07-25 ENCOUNTER — Ambulatory Visit (INDEPENDENT_AMBULATORY_CARE_PROVIDER_SITE_OTHER): Payer: BC Managed Care – PPO | Admitting: Surgery

## 2021-07-25 VITALS — BP 131/79 | HR 101 | Ht 62.5 in | Wt 226.6 lb

## 2021-07-25 DIAGNOSIS — M2550 Pain in unspecified joint: Secondary | ICD-10-CM

## 2021-07-25 DIAGNOSIS — M5442 Lumbago with sciatica, left side: Secondary | ICD-10-CM

## 2021-07-25 DIAGNOSIS — M461 Sacroiliitis, not elsewhere classified: Secondary | ICD-10-CM

## 2021-07-25 DIAGNOSIS — M545 Low back pain, unspecified: Secondary | ICD-10-CM

## 2021-07-25 NOTE — Progress Notes (Signed)
Office Visit Note   Patient: [name]           Date of Birth: [birth year]           MRN: [id number] Visit Date: 07/25/2021              Requested by: Ofilia Neas, PA-C [id number],  [place] [address] PCP: Marda Stalker, PA-C   Assessment & Plan: Visit Diagnoses:  1. Polyarthralgia   2. Acute midline low back pain without sciatica   3. SI (sacroiliac) joint inflammation ([facility])     Plan: Today below are external to check a CBC and arthritis panel.  Patient will finish out prednisone taper.  Start formal PT.  Follow-up with me in 2 weeks for recheck.  We will make decision as to whether or not MRI is indicated.  We will also review labs.  Follow-Up Instructions: Return in about 2 weeks (around 08/08/2021) for With [facility] recheck low back pain and review labs.   Orders:  Orders Placed This Encounter  Procedures   Antinuclear Antib (ANA)   Rheumatoid Factor   Uric acid   Sed Rate (ESR)   HLA-B27 antigen   CBC   Ambulatory referral to Physical Therapy   No orders of the defined types were placed in this encounter.     Procedures: No procedures performed   Clinical Data: No additional findings.   Subjective: Chief Complaint  Patient presents with   Lower Back - New Patient (Initial Visit)    HPI 41 year old female comes in for evaluation of low back pain.  Patient has been followed by Hazel Sams, PA for this and was most recently seen July 18, 2021 in the rheumatology clinic.  Diagnosis of fibromyalgia and positive ANA.Marland Kitchen  She was recently prescribed oral prednisone and has 1 more day left and also Robaxin for spasms and Cymbalta.  Has had some improvement with the prednisone.  She just started PT.  Pain aggravated when she is ambulating. Review of Systems No current complaints of cardiopulmonary GI/GU issues  Objective: Vital Signs: BP 131/79 (BP Location: Right Arm, Patient Position: Sitting, Cuff Size: Large)   Pulse (!) 101   Ht  5' 2.5" (1.588 m)   Wt 226 lb 9.6 oz (102.8 kg)   SpO2 97%   BMI 40.79 kg/m   Physical Exam HENT:     Head: Normocephalic and atraumatic.  Eyes:     Extraocular Movements: Extraocular movements intact.  Musculoskeletal:     Comments: On exam patient has left greater than right SI joint tenderness.  Mild to moderate tenderness of the left hip greater trochanter bursa.  Neurological:     Mental Status: She is oriented to person, place, and time.  Psychiatric:        Mood and Affect: Mood normal.     Ortho Exam  Specialty Comments:  No specialty comments available.  Imaging: No results found.   PMFS History: Patient Active Problem List   Diagnosis Date Noted   Body mass index (BMI) 39.0-39.9, adult 05/23/2021   Fibromyalgia 05/03/2020   Chondromalacia of right patella 05/03/2020   Family history of systemic lupus erythematosus 09/16/2018   Hx of migraines 08/21/2018   Prediabetes 08/21/2018   Vitamin D deficiency 08/21/2018   History of carpal tunnel syndrome 08/21/2018   Past Medical History:  Diagnosis Date   Healthy adult    Migraine     Family History  Problem Relation Age of Onset  Hypertension Mother    Diabetes Mother    Asthma Mother    Asthma Father    Heart attack Father    Hypertension Brother    Heart attack Brother    Asthma Brother    Healthy Son    Healthy Daughter     Past Surgical History:  Procedure Laterality Date   ABLATION     CHOLECYSTECTOMY     TONSILLECTOMY     TUBAL LIGATION     Social History   Occupational History   Occupation: Schools  Tobacco Use   Smoking status: Never   Smokeless tobacco: Never  Vaping Use   Vaping Use: Never used  Substance and Sexual Activity   Alcohol use: Yes    Comment: occ   Drug use: Never   Sexual activity: Not on file

## 2021-07-30 NOTE — Therapy (Signed)
OUTPATIENT PHYSICAL THERAPY TREATMENT NOTE   Patient Name: [name] MRN: [id number] DOB:1980/08/04, 41 y.o., female Today's Date: 07/31/2021  PCP: Jarrett Soho, PA-C REFERRING PROVIDER: Pollyann Savoy, MD   PT End of Session - 07/31/21 1053     Visit Number 3    Number of Visits 8    Date for PT Re-Evaluation 09/04/21    Authorization Type BCBS    PT Start Time 1048    PT Stop Time 1128    PT Time Calculation (min) 40 min    Activity Tolerance Patient tolerated treatment well    Behavior During Therapy WFL for tasks assessed/performed              Past Medical History:  Diagnosis Date   Healthy adult    Migraine    Past Surgical History:  Procedure Laterality Date   ABLATION     CHOLECYSTECTOMY     TONSILLECTOMY     TUBAL LIGATION     Patient Active Problem List   Diagnosis Date Noted   Body mass index (BMI) 39.0-39.9, adult 05/23/2021   Fibromyalgia 05/03/2020   Chondromalacia of right patella 05/03/2020   Family history of systemic lupus erythematosus 09/16/2018   Hx of migraines 08/21/2018   Prediabetes 08/21/2018   Vitamin D deficiency 08/21/2018   History of carpal tunnel syndrome 08/21/2018    REFERRING PROVIDER: Pollyann Savoy, MD   REFERRING DIAG: Chondromalacia of right patella, Pain in both hands  THERAPY DIAG:  Acute left-sided low back pain, unspecified whether sciatica present  Chronic pain of right knee  Pain in left hand  Pain in right hand  Muscle weakness (generalized)  PERTINENT HISTORY: Fibromyalgia  PRECAUTIONS: None WEIGHT BEARING RESTRICTIONS: No  SUBJECTIVE: Patient reports she saw the back doctor and he said he couldn't really do anything for her because she was still taking her prednisone, so she is rescheduled for beginning of March. Patient also states that she is going to switch PT clinics because her doctor is at the other office and he wanted her to all her care in that building.  PAIN:  Are you  having pain? Yes VAS scale: 2/10 Pain location: Back Pain orientation: Lower, left PAIN TYPE: Acute Pain description: Aching Aggravating factors: Bending, standing long period Relieving factors: Rest, medication  Are you having pain? No VAS scale: 0-2/10 Pain location: Knee Pain orientation: Right  PAIN TYPE: Chronic Pain description: Aching  Aggravating factors: Walking and standing extended periods, stairs, driving, squatting Relieving factors: Rest, medication   OBJECTIVE: (BOLDED MEASURES ASSESSED THIS VISIT) PATIENT SURVEYS:  FOTO 51% functional status   LE AROM/PROM:   A/PROM Right 07/10/2021 Left 07/10/2021  Knee flexion 120 122  Knee extension 0 0  Ankle dorsiflexion 5 8   MMT:   MMT Right 07/10/2021 Left 07/10/2021  Hip flexion 4 4  Hip extension 4- 4-  Hip abduction 4- 4-  Knee flexion 5 5  Knee extension 4+ 5  Grip 37, 30, 30 75, 75, 75   Core strength grossly 4-/5 MMT with increase low back pain upon testing  LUMBAR AROM  AROM   07/23/2021  07/31/2021  Flexion 75% - fingertips to mid shin, difficulty rising from bent position [facility] - fingertips to ankles, no increased low back pain, mildly difficult rising with increased lumbar lordosis  Extension WFL   Right lateral flexion WFL   Left lateral flexion WFL   Right rotation WFL   Left rotation 75%     TODAY'S TREATMENT:  07/31/2021: NuStep L4 x 6 min with UE/LE while taking subjective LTR 10 x 5 sec each Bridge 2 x 10 with 3 sec hold SLR x 10 each Sidelying hip abduction x 10 each Banded pull down with green 2 x 10 - cue for pelvic tilt to engage core Pallof press with green 2 x 10 each with 3 sec hold Squat to chair tap 2 x 10   07/23/2021: NuStep L4 x 6 min with UE/LE while taking subjective Supine SKTC stretch 2 x 30 sec each LTR 5 x 5 sec each PPT 2 x 5 with 5 sec hold SLR 2 x 5 each, partial range with focus on core activation Sidelying hip abduction 2 x 10 each Sit to stand 2 x 5  without UE support   07/10/2021: SLR x 10 Sidelying hip abduction x 10 Bridge 10 x 3 sec hold Sit to stand x 10 without UE support     PATIENT EDUCATION:  Education details: HEP update Person educated: Patient Education method: Explanation, Demonstration, Tactile cues, Verbal cues, and Handouts Education comprehension: verbalized understanding, returned demonstration, verbal cues required, tactile cues required, and needs further education   HOME EXERCISE PROGRAM: Access Code: [email or web address]     ASSESSMENT: CLINICAL IMPRESSION: Patient tolerated therapy well with no adverse effects. Therapy focused on progress of core/hip/LE strengthening to primarily address low band and right knee pain. Patient able to tolerate progressions back to previous exercises performed prior to onset of low back pain, and progress core strengthening to standing exercises. She does continue to exhibit difficulty control excessive lumbar lordosis and requires consistent cueing for posterior pelvic tilt to engage her core. Updated her HEP with good tolerance. Patient would benefit from continued skilled PT to progress mobility and strength in order to reduce pain and maximize functional ability.     GOALS: Goals reviewed with patient? Yes   SHORT TERM GOALS:   STG Name Target Date Goal status  1 Patient will be I with initial HEP in order to progress with therapy. Baseline: updated HEP 08/07/2021 ONGOING  2 PT will review FOTO with patient by 3rd visit in order to understand expected progress and outcome with therapy. Baseline: assessed at eval 08/07/2021 INITIAL  3 Patient will demonstrate proper squat mechanics with </= 4/10 pain to improve household tasks. Baseline: squat deviations with up to 8/10 pain 08/07/2021 INITIAL    LONG TERM GOALS:    LTG Name Target Date Goal status  1 Patient will be I with final HEP to maintain progress from PT. Baseline: 09/04/2021 INITIAL  2 Patient will report >/= 60% status  on FOTO to indicate improved functional ability. Baseline: 51% functional status 09/04/2021 INITIAL  3 Patient will demonstrate 5/5 right knee strength and >/= 4/5 MMT hip strength to improve stair negotiation Baseline: 4+/5 quad strength, 4-/5 hip strength 09/04/2021 INITIAL  4 Patient will demonstrate right grip strength >/= 50 lbs to improve opening jars Baseline: 32 lbs 09/04/2021 INITIAL  5 Patient will report right knee pain </= 2/10 with activities such as driving to improve work ability Baseline: up to 8/10 pain 09/04/2021 INITIAL  6 Patient will demonstrate lumbar AROM grossly WFL to improve mobility and household tasks Baseline: limitation with lumbar flexion and rotation 09/04/2021 INITIAL     PLAN: PT FREQUENCY: 1x/week   PT DURATION: 8 weeks   PLANNED INTERVENTIONS: Therapeutic exercises, Therapeutic activity, Neuro Muscular re-education, Balance training, Gait training, Patient/Family education, Joint mobilization, Stair training, Aquatic Therapy, Dry Needling, Electrical stimulation,  Spinal mobilization, Cryotherapy, Moist heat, Taping, Traction, Ultrasound, Ionotophoresis 4mg /ml Dexamethasone, and Manual therapy   PLAN FOR NEXT SESSION: Review HEP and progress PRN, further assessment of bilateral carpal tunnel (strength, motion, cervical, neurodynamics), progress right quad strength, hip strengthening, step ups, squats, core strengthening and lumbar mobility    , PT, DPT, LAT, ATC 07/31/21  11:32 AM Phone: [phone] Fax: [phone]

## 2021-07-31 ENCOUNTER — Ambulatory Visit: Payer: BC Managed Care – PPO | Admitting: Physical Therapy

## 2021-07-31 ENCOUNTER — Encounter: Payer: Self-pay | Admitting: Physical Therapy

## 2021-07-31 ENCOUNTER — Other Ambulatory Visit: Payer: Self-pay

## 2021-07-31 DIAGNOSIS — M79641 Pain in right hand: Secondary | ICD-10-CM

## 2021-07-31 DIAGNOSIS — G8929 Other chronic pain: Secondary | ICD-10-CM

## 2021-07-31 DIAGNOSIS — M79642 Pain in left hand: Secondary | ICD-10-CM

## 2021-07-31 DIAGNOSIS — M545 Low back pain, unspecified: Secondary | ICD-10-CM | POA: Diagnosis not present

## 2021-07-31 DIAGNOSIS — M25561 Pain in right knee: Secondary | ICD-10-CM

## 2021-07-31 DIAGNOSIS — M6281 Muscle weakness (generalized): Secondary | ICD-10-CM

## 2021-07-31 NOTE — Patient Instructions (Signed)
Access Code: [email or web address] URL: [email or web address] Date: 07/31/2021 Prepared by: Rosana Hoes  Exercises Hooklying Single Knee to Chest Stretch - 2 x daily - 7 x weekly - 3 reps - 30 seconds hold Supine Lower Trunk Rotation - 2 x daily - 7 x weekly - 10 reps - 5 seconds hold Supine Posterior Pelvic Tilt - 1 x daily - 7 x weekly - 10 reps - 5 seconds hold Small Range Straight Leg Raise - 1 x daily - 7 x weekly - 2 sets - 10 reps Bridge - 1 x daily - 7 x weekly - 2 sets - 10 reps - 3 seconds hold Sidelying Hip Abduction - 1 x daily - 7 x weekly - 2 sets - 10 reps Sit to Stand Without Arm Support - 1 x daily - 7 x weekly - 2 sets - 10 reps Scapular Retraction with Resistance Advanced - 1 x daily - 7 x weekly - 2 sets - 10 reps Standing Anti-Rotation Press with Anchored Resistance - 1 x daily - 7 x weekly - 2 sets - 10 reps Putty Squeezes - 1 x daily - 7 x weekly - 2 sets - 10 reps

## 2021-08-06 ENCOUNTER — Ambulatory Visit: Payer: BC Managed Care – PPO | Admitting: Physical Therapy

## 2021-08-07 ENCOUNTER — Ambulatory Visit (INDEPENDENT_AMBULATORY_CARE_PROVIDER_SITE_OTHER): Payer: BC Managed Care – PPO | Admitting: Physical Therapy

## 2021-08-07 ENCOUNTER — Encounter: Payer: Self-pay | Admitting: Physical Therapy

## 2021-08-07 ENCOUNTER — Other Ambulatory Visit: Payer: Self-pay

## 2021-08-07 DIAGNOSIS — M79641 Pain in right hand: Secondary | ICD-10-CM | POA: Diagnosis not present

## 2021-08-07 DIAGNOSIS — M79642 Pain in left hand: Secondary | ICD-10-CM | POA: Diagnosis not present

## 2021-08-07 DIAGNOSIS — M25561 Pain in right knee: Secondary | ICD-10-CM | POA: Diagnosis not present

## 2021-08-07 DIAGNOSIS — M545 Low back pain, unspecified: Secondary | ICD-10-CM | POA: Diagnosis not present

## 2021-08-07 DIAGNOSIS — M6281 Muscle weakness (generalized): Secondary | ICD-10-CM

## 2021-08-07 DIAGNOSIS — G8929 Other chronic pain: Secondary | ICD-10-CM

## 2021-08-07 NOTE — Therapy (Signed)
[facility] [address] [place], [place], [address] Phone: [phone]   Fax:  [phone]  Physical Therapy Treatment  Patient Details  Name: [name] MRN: [id number] Date of Birth: [birth year] Referring Provider (PT): Sherron Ales PA-C   Encounter Date: 08/07/2021   PT End of Session - 08/07/21 1101     Visit Number 4    Number of Visits 8    Date for PT Re-Evaluation 09/04/21    Authorization Type BCBS    PT Start Time 1015    PT Stop Time 1101    PT Time Calculation (min) 46 min    Activity Tolerance Patient tolerated treatment well    Behavior During Therapy [facility] for tasks assessed/performed             Past Medical History:  Diagnosis Date   Healthy adult    Migraine     Past Surgical History:  Procedure Laterality Date   ABLATION     CHOLECYSTECTOMY     TONSILLECTOMY     TUBAL LIGATION      There were no vitals filed for this visit.   Subjective Assessment - 08/07/21 1019     Subjective she relays the bilat hand pain is not as bad as it is but she still gets fibromyalgia pains that make her hand pain feel worse and she cant open jars etc. She relays 2/10 pain in her hands with N/T. She says her back pain is ok at rest but bothered by standing up for long periods of time or walking more than 15 minutes. Her Rt anterior knee pain is not bothered right now at rest but is  aggravated by steps or bending it.                [facility] PT Assessment - 08/07/21 0001       Assessment   Medical Diagnosis back pain, pain in Rt knee, bilat CTS    Referring Provider (PT) Sherron Ales PA-C    Hand Dominance Right    Next MD Visit 09/13/21      ROM / Strength   AROM / PROM / Strength AROM;Strength      Strength   Overall Strength Comments bilat wrist strength and finger interosseus muscles 4/5 grossly, grip strength on Rt 36.7 lbs and on Lt 63.5 lbs      Special Tests   Other special tests + upper limb neural tension  tests for Rt UE median and radial, and for Lt UE median, radial, and ulnar                           [facility] Adult PT Treatment/Exercise - 08/07/21 0001       Exercises   Exercises Other Exercises    Other Exercises  bilat wrist flexion 2# X15, bilat wrist extension 2# X15, bilat wrist supination/pronation 2# X15, bilat radial nerve floss X 10, bilat median nerve floss X 10, bilat leg extension machine 15# 2X15, TRX rows X15, TRX squats X15                                 Plan - 08/07/21 1113     Clinical Impression Statement Session today focused on further assessment of her bilat carpal tunnel syndromes. She does have noted bilateral hand and wrist weakness with + upper limb neural tension testing so I provided her  with additional HEP for this and we reviewed this today with good understanding. Then addiitonal treatment time we had left we focused on general back and leg strengthening with good overall tolerance. She plans to trial HEP and will return to PT 1 time per week.    PT Next Visit Plan how is HEP for hands, work on hands, back, core, and leg strength as tolerated    Consulted and Agree with Plan of Care Patient           HOME EXERCISE PROGRAM: Access Code: [email or web address]         GOALS: Goals reviewed with patient? Yes   SHORT TERM GOALS:   STG Name Target Date Goal status  1 Patient will be I with initial HEP in order to progress with therapy. Baseline: updated HEP 08/07/2021 ONGOING  2 PT will review FOTO with patient by 3rd visit in order to understand expected progress and outcome with therapy. Baseline: assessed at eval 08/07/2021 INITIAL  3 Patient will demonstrate proper squat mechanics with </= 4/10 pain to improve household tasks. Baseline: squat deviations with up to 8/10 pain 08/07/2021 INITIAL    LONG TERM GOALS:    LTG Name Target Date Goal status  1 Patient will be I with final HEP to maintain progress from  PT. Baseline: 09/04/2021 INITIAL  2 Patient will report >/= 60% status on FOTO to indicate improved functional ability. Baseline: 51% functional status 09/04/2021 INITIAL  3 Patient will demonstrate 5/5 right knee strength and >/= 4/5 MMT hip strength to improve stair negotiation Baseline: 4+/5 quad strength, 4-/5 hip strength 09/04/2021 INITIAL  4 Patient will demonstrate right grip strength >/= 50 lbs to improve opening jars Baseline: 32 lbs 09/04/2021 INITIAL  5 Patient will report right knee pain </= 2/10 with activities such as driving to improve work ability Baseline: up to 8/10 pain 09/04/2021 INITIAL  6 Patient will demonstrate lumbar AROM grossly WFL to improve mobility and household tasks Baseline: limitation with lumbar flexion and rotation 09/04/2021 INITIAL      PLAN: PT FREQUENCY: 1x/week   PT DURATION: 8 weeks   PLANNED INTERVENTIONS: Therapeutic exercises, Therapeutic activity, Neuro Muscular re-education, Balance training, Gait training, Patient/Family education, Joint mobilization, Stair training, Aquatic Therapy, Dry Needling, Electrical stimulation, Spinal mobilization, Cryotherapy, Moist heat, Taping, Traction, Ultrasound, Ionotophoresis 4mg /ml Dexamethasone, and Manual therapy   PLAN FOR NEXT SESSION: Review HEP and progress PRN, further assessment of bilateral carpal tunnel (strength, motion, cervical, neurodynamics), progress right quad strength, hip strengthening, step ups, squats, core strengthening and lumbar mobility   Patient will benefit from skilled therapeutic intervention in order to improve the following deficits and impairments:     Visit Diagnosis: Acute left-sided low back pain, unspecified whether sciatica present  Chronic pain of right knee  Pain in left hand  Pain in right hand  Muscle weakness (generalized)     Problem List Patient Active Problem List   Diagnosis Date Noted   Body mass index (BMI) 39.0-39.9, adult 05/23/2021    Fibromyalgia 05/03/2020   Chondromalacia of right patella 05/03/2020   Family history of systemic lupus erythematosus 09/16/2018   Hx of migraines 08/21/2018   Prediabetes 08/21/2018   Vitamin D deficiency 08/21/2018   History of carpal tunnel syndrome 08/21/2018    10/20/2018, PT,DPT 08/07/2021, 11:23 AM  [facility] Physical [facility] [address] [place], [place], [place] Phone: [phone]   Fax:  [phone]  Name: Janace Decker MRN: Donnella Bi Date of Birth: 11-May-1981

## 2021-08-09 ENCOUNTER — Encounter: Payer: BC Managed Care – PPO | Admitting: Physical Therapy

## 2021-08-14 ENCOUNTER — Encounter: Payer: BC Managed Care – PPO | Admitting: Physical Therapy

## 2021-08-15 ENCOUNTER — Other Ambulatory Visit: Payer: Self-pay

## 2021-08-15 ENCOUNTER — Encounter: Payer: Self-pay | Admitting: Physical Therapy

## 2021-08-15 ENCOUNTER — Ambulatory Visit (INDEPENDENT_AMBULATORY_CARE_PROVIDER_SITE_OTHER): Payer: BC Managed Care – PPO | Admitting: Physical Therapy

## 2021-08-15 DIAGNOSIS — G8929 Other chronic pain: Secondary | ICD-10-CM

## 2021-08-15 DIAGNOSIS — M545 Low back pain, unspecified: Secondary | ICD-10-CM

## 2021-08-15 DIAGNOSIS — M25561 Pain in right knee: Secondary | ICD-10-CM | POA: Diagnosis not present

## 2021-08-15 DIAGNOSIS — M79641 Pain in right hand: Secondary | ICD-10-CM | POA: Diagnosis not present

## 2021-08-15 DIAGNOSIS — M6281 Muscle weakness (generalized): Secondary | ICD-10-CM

## 2021-08-15 DIAGNOSIS — M79642 Pain in left hand: Secondary | ICD-10-CM | POA: Diagnosis not present

## 2021-08-15 NOTE — Therapy (Signed)
OUTPATIENT PHYSICAL THERAPY TREATMENT NOTE   Patient Name: [name] MRN: [id number] DOB:01/22/1981, 41 y.o., female Today's Date: 08/15/2021  PCP: Marda Stalker, PA-C REFERRING PROVIDER: Ofilia Neas, PA-C   PT End of Session - 08/15/21 1025     Visit Number 5    Number of Visits 8    Date for PT Re-Evaluation 09/04/21    Authorization Type BCBS    PT Start Time 0930    PT Stop Time 1016    PT Time Calculation (min) 46 min    Activity Tolerance Patient tolerated treatment well    Behavior During Therapy WFL for tasks assessed/performed             Past Medical History:  Diagnosis Date   Healthy adult    Migraine    Past Surgical History:  Procedure Laterality Date   ABLATION     CHOLECYSTECTOMY     TONSILLECTOMY     TUBAL LIGATION     Patient Active Problem List   Diagnosis Date Noted   Body mass index (BMI) 39.0-39.9, adult 05/23/2021   Fibromyalgia 05/03/2020   Chondromalacia of right patella 05/03/2020   Family history of systemic lupus erythematosus 09/16/2018   Hx of migraines 08/21/2018   Prediabetes 08/21/2018   Vitamin D deficiency 08/21/2018   History of carpal tunnel syndrome 08/21/2018    REFERRING PROVIDER: Bo Merino, MD   REFERRING DIAG: Chondromalacia of right patella, Pain in both hands, low back pain  THERAPY DIAG:  Acute left-sided low back pain, unspecified whether sciatica present  Chronic pain of right knee  Pain in left hand  Pain in right hand  Muscle weakness (generalized)  PERTINENT HISTORY: Fibromyalgia  PRECAUTIONS: None, no weight bearing precautions  SUBJECTIVE: Her biggest complaint is general stiffness today more so than pain.  PAIN:  PAIN:  Are you having pain? Yes VAS scale: 2/10 Pain location: Back, hands           Aggravating factors: Bending, standing long period Relieving factors: Rest, medication     Today's treatment 08/15/21 Therapeutic Exercise:  Aerobic:Nu step L6X8 min with  heat to low back  Supine: Prone:  Seated:Leg press machine DL 2X15 at 81#. (row machine, chest press machine, and lat pull down machine all 2X15 at 15#). leg extension machine 15# 2X15 bilat. hamstring curl machine 25# 2X15 bilat. Bilat wrist flexion 2# X15, bilat wrist extension 2# X15, bilat wrist supination/pronation 2# X15,  Standing: Neuromuscular Re-education: Manual Therapy: Therapeutic Activity: Self Care: Trigger Point Dry Needling:  Modalities:       [facility] Adult PT Treatment/Exercise - 08/07/21 0001                Exercises    Exercises Other Exercises     Other Exercises  bilat wrist flexion 2# X15, bilat wrist extension 2# X15, bilat wrist supination/pronation 2# X15, bilat radial nerve floss X 10, bilat median nerve floss X 10, bilat leg extension machine 15# 2X15, TRX rows X15, TRX squats X15       ASSESSMENT: CLINICAL IMPRESSION: Since her pain levels had improved session focused more on general UE/LE strengthening program using resistance equipment that she would be able to perform at a gym. This would help improve her overall functional progress with PT. She had good overall tolerance to session without significant complaints. She will set up another PT appointment for the next 1-2 weeks to check in on how she is doing before she follows up with MD.  HOME EXERCISE PROGRAM: Access Code: [email or web address]         GOALS: Goals reviewed with patient? Yes   SHORT TERM GOALS:   STG Name Target Date Goal status  1 Patient will be I with initial HEP in order to progress with therapy. Baseline: updated HEP 08/07/2021 ONGOING  2 PT will review FOTO with patient by 3rd visit in order to understand expected progress and outcome with therapy. Baseline: assessed at eval 08/07/2021 ongoing  3 Patient will demonstrate proper squat mechanics with </= 4/10 pain to improve household tasks. Baseline: squat deviations with up to 8/10 pain 08/07/2021 Met    LONG TERM GOALS:     LTG Name Target Date Goal status  1 Patient will be I with final HEP to maintain progress from PT. Baseline: 09/04/2021 ongoing  2 Patient will report >/= 60% status on FOTO to indicate improved functional ability. Baseline: 51% functional status 09/04/2021 ongoing  3 Patient will demonstrate 5/5 right knee strength and >/= 4/5 MMT hip strength to improve stair negotiation Baseline: 4+/5 quad strength, 4-/5 hip strength 09/04/2021 ongoing  4 Patient will demonstrate right grip strength >/= 50 lbs to improve opening jars Baseline: 32 lbs 09/04/2021 ongoing  5 Patient will report right knee pain </= 2/10 with activities such as driving to improve work ability Baseline: up to 8/10 pain 09/04/2021 ongoing  6 Patient will demonstrate lumbar AROM grossly WFL to improve mobility and household tasks Baseline: limitation with lumbar flexion and rotation 09/04/2021 ongoing      PLAN: PT FREQUENCY: 1x/week   PT DURATION: 8 weeks   PLANNED INTERVENTIONS: Therapeutic exercises, Therapeutic activity, Neuro Muscular re-education, Balance training, Gait training, Patient/Family education, Joint mobilization, Stair training, Aquatic Therapy, Dry Needling, Electrical stimulation, Spinal mobilization, Cryotherapy, Moist heat, Taping, Traction, Ultrasound, Ionotophoresis 2m/ml Dexamethasone, and Manual therapy   PLAN FOR NEXT SESSION: updated measurements and goals for MD follow up note   Patient will benefit from skilled therapeutic intervention in order to improve the following deficits and impairments:     Visit Diagnosis: Acute left-sided low back pain, unspecified whether sciatica present  Chronic pain of right knee  Pain in left hand  Pain in right hand  Muscle weakness (generalized)     Problem List Patient Active Problem List   Diagnosis Date Noted   Body mass index (BMI) 39.0-39.9, adult 05/23/2021   Fibromyalgia 05/03/2020   Chondromalacia of right patella 05/03/2020   Family  history of systemic lupus erythematosus 09/16/2018   Hx of migraines 08/21/2018   Prediabetes 08/21/2018   Vitamin D deficiency 08/21/2018   History of carpal tunnel syndrome 08/21/2018    BDebbe Odea PT,DPT 08/15/2021, 10:26 AM  [facility] [address] [address]Phone: [phone]  Fax:  [phone] Name: RStepanie GraverMRN: [id number] of Birth: [birth year]

## 2021-08-16 ENCOUNTER — Encounter: Payer: BC Managed Care – PPO | Admitting: Physical Therapy

## 2021-08-22 ENCOUNTER — Ambulatory Visit: Payer: BC Managed Care – PPO | Admitting: Surgery

## 2021-08-27 ENCOUNTER — Other Ambulatory Visit: Payer: Self-pay

## 2021-08-27 ENCOUNTER — Encounter: Payer: Self-pay | Admitting: Physical Therapy

## 2021-08-27 ENCOUNTER — Ambulatory Visit (INDEPENDENT_AMBULATORY_CARE_PROVIDER_SITE_OTHER): Payer: BC Managed Care – PPO | Admitting: Physical Therapy

## 2021-08-27 DIAGNOSIS — G8929 Other chronic pain: Secondary | ICD-10-CM

## 2021-08-27 DIAGNOSIS — M25561 Pain in right knee: Secondary | ICD-10-CM

## 2021-08-27 DIAGNOSIS — M6281 Muscle weakness (generalized): Secondary | ICD-10-CM

## 2021-08-27 DIAGNOSIS — M545 Low back pain, unspecified: Secondary | ICD-10-CM

## 2021-08-27 DIAGNOSIS — M79642 Pain in left hand: Secondary | ICD-10-CM

## 2021-08-27 DIAGNOSIS — M79641 Pain in right hand: Secondary | ICD-10-CM | POA: Diagnosis not present

## 2021-08-27 NOTE — Therapy (Signed)
OUTPATIENT PHYSICAL THERAPY TREATMENT NOTE   Patient Name: [name] MRN: [id number] DOB:[birth year], 41 y.o., female Today's Date: 08/27/2021  PCP: Marda Stalker, PA-C REFERRING PROVIDER: Ofilia Neas, PA-C   PT End of Session - 08/27/21 1035     Visit Number 6    Number of Visits 8    Date for PT Re-Evaluation 09/04/21    Authorization Type BCBS    PT Start Time 0932    PT Stop Time 1019    PT Time Calculation (min) 47 min    Activity Tolerance Patient tolerated treatment well    Behavior During Therapy WFL for tasks assessed/performed              Past Medical History:  Diagnosis Date   Healthy adult    Migraine    Past Surgical History:  Procedure Laterality Date   ABLATION     CHOLECYSTECTOMY     TONSILLECTOMY     TUBAL LIGATION     Patient Active Problem List   Diagnosis Date Noted   Body mass index (BMI) 39.0-39.9, adult 05/23/2021   Fibromyalgia 05/03/2020   Chondromalacia of right patella 05/03/2020   Family history of systemic lupus erythematosus 09/16/2018   Hx of migraines 08/21/2018   Prediabetes 08/21/2018   Vitamin D deficiency 08/21/2018   History of carpal tunnel syndrome 08/21/2018    REFERRING PROVIDER: Bo Merino, MD   REFERRING DIAG: Chondromalacia of right patella, Pain in both hands, low back pain  THERAPY DIAG:  Acute left-sided low back pain, unspecified whether sciatica present  Chronic pain of right knee  Pain in left hand  Pain in right hand  Muscle weakness (generalized)  PERTINENT HISTORY: Fibromyalgia  PRECAUTIONS: None, no weight bearing precautions  SUBJECTIVE: She relays she is improving overall, has some stiffness but not much pain today  PAIN:  PAIN:  Are you having pain? Yes VAS scale: 1/10 Pain location: Back, hands           Aggravating factors: Bending, standing long period Relieving factors: Rest, medication  Objective measurments: Gross UE/LE strength now 5/5 MMT bilat, gross  lumbar AROM now [facility].    Today's treatment 08/27/21 Therapeutic Exercise:  Aerobic:Nu step L6X10 min  Supine: Prone:  Seated:Leg press machine DL 2X15 at 100#. (row machine, chest press machine, and lat pull down machine all 2X15 at 20#). leg extension machine 20# 2X15 bilat. hamstring curl machine 35# 2X15 bilat. Bilat wrist flexion 3# 2X15, bilat wrist extension 3# 2X15, bilat wrist supination/pronation 3# X15,  Standing: Neuromuscular Re-education: Manual Therapy: Therapeutic Activity: Self Care: Trigger Point Dry Needling:  Modalities:    Today's treatment 08/15/21 Therapeutic Exercise:  Aerobic:Nu step L6X8 min with heat to low back  Supine: Prone:  Seated:Leg press machine DL 2X15 at 81#. (row machine, chest press machine, and lat pull down machine all 2X15 at 15#). leg extension machine 15# 2X15 bilat. hamstring curl machine 25# 2X15 bilat. Bilat wrist flexion 2# X15, bilat wrist extension 2# X15, bilat wrist supination/pronation 2# X15,  Standing:       [facility] Adult PT Treatment/Exercise - 08/07/21 0001                Exercises    Exercises Other Exercises     Other Exercises  bilat wrist flexion 2# X15, bilat wrist extension 2# X15, bilat wrist supination/pronation 2# X15, bilat radial nerve floss X 10, bilat median nerve floss X 10, bilat leg extension machine 15# 2X15, TRX rows X15, TRX  squats X15       ASSESSMENT: CLINICAL IMPRESSION: Since she is at a good functional level and her pain levels had improved we will hold off on PT for now for her to trial independent program. She will also follow up with MD soon and if further PT is recommended we will put her back on the schedule. If she does not return in 30-45 days we will discharge her case at that time.         HOME EXERCISE PROGRAM: Access Code: [email or web address]         GOALS: Goals reviewed with patient? Yes   SHORT TERM GOALS:   STG Name Target Date Goal status  1 Patient will be I with initial HEP in  order to progress with therapy. Baseline: updated HEP 08/07/2021 Met  2 PT will review FOTO with patient by 3rd visit in order to understand expected progress and outcome with therapy. Baseline: assessed at eval 08/07/2021 Did not capture  3 Patient will demonstrate proper squat mechanics with </= 4/10 pain to improve household tasks. Baseline: squat deviations with up to 8/10 pain 08/07/2021 Met    LONG TERM GOALS:    LTG Name Target Date Goal status  1 Patient will be I with final HEP to maintain progress from PT. Baseline: 09/04/2021 Met  2 Patient will report >/= 60% status on FOTO to indicate improved functional ability. Baseline: 51% functional status 09/04/2021 Did not capture  3 Patient will demonstrate 5/5 right knee strength and >/= 4/5 MMT hip strength to improve stair negotiation Baseline: now 5/5 09/04/2021 Met  4 Patient will demonstrate right grip strength >/= 50 lbs to improve opening jars Baseline: 32 lbs 09/04/2021 Did not capture  5 Patient will report right knee pain </= 2/10 with activities such as driving to improve work ability Baseline: up to 8/10 pain 09/04/2021 Met  6 Patient will demonstrate lumbar AROM grossly WFL to improve mobility and household tasks Baseline: lnow [facility] 09/04/2021 Met      PLAN: PT FREQUENCY: 1x/week   PT DURATION: 8 weeks   PLANNED INTERVENTIONS: Therapeutic exercises, Therapeutic activity, Neuro Muscular re-education, Balance training, Gait training, Patient/Family education, Joint mobilization, Stair training, Aquatic Therapy, Dry Needling, Electrical stimulation, Spinal mobilization, Cryotherapy, Moist heat, Taping, Traction, Ultrasound, Ionotophoresis 52m/ml Dexamethasone, and Manual therapy   PLAN FOR NEXT SESSION: hold PT and will close after 45 days if she does not feel she needs to return   Patient will benefit from skilled therapeutic intervention in order to improve the following deficits and impairments:     Visit Diagnosis: Acute  left-sided low back pain, unspecified whether sciatica present  Chronic pain of right knee  Pain in left hand  Pain in right hand  Muscle weakness (generalized)     Problem List Patient Active Problem List   Diagnosis Date Noted   Body mass index (BMI) 39.0-39.9, adult 05/23/2021   Fibromyalgia 05/03/2020   Chondromalacia of right patella 05/03/2020   Family history of systemic lupus erythematosus 09/16/2018   Hx of migraines 08/21/2018   Prediabetes 08/21/2018   Vitamin D deficiency 08/21/2018   History of carpal tunnel syndrome 08/21/2018    BDebbe Odea PT,DPT 08/27/2021, 10:49 AM  [facility] [address] [address]Phone: [phone]  Fax:  [phone] Name: RBrande UncapherMRN: [id number] of Birth: 920-Aug-1982

## 2021-08-29 ENCOUNTER — Other Ambulatory Visit: Payer: Self-pay

## 2021-08-29 ENCOUNTER — Encounter: Payer: Self-pay | Admitting: Surgery

## 2021-08-29 ENCOUNTER — Ambulatory Visit (INDEPENDENT_AMBULATORY_CARE_PROVIDER_SITE_OTHER): Payer: BC Managed Care – PPO | Admitting: Surgery

## 2021-08-29 VITALS — BP 127/81 | HR 98 | Ht 62.5 in | Wt 226.0 lb

## 2021-08-29 DIAGNOSIS — M545 Low back pain, unspecified: Secondary | ICD-10-CM | POA: Diagnosis not present

## 2021-08-29 NOTE — Progress Notes (Addendum)
Office Visit Note   Patient: [name]           Date of Birth: Nov 29, 1980           MRN: [id number] Visit Date: 08/29/2021              Requested by: Jarrett Soho, PA-C [address] [place],  [place] [address] PCP: Jarrett Soho, PA-C   Assessment & Plan: Visit Diagnoses:  1. Acute midline low back pain without sciatica     Plan: Since patient continues to have ongoing back pain that is failed conservative treatment I will schedule lumbar MRI to rule out HNP/stenosis.  Follow-up with Dr. Ophelia Charter in 3 weeks for recheck and to discuss results.  We will review previous labs drawn at next office visit  Follow-Up Instructions: Return in about 3 weeks (around 09/19/2021) for with dr yates to review lumbar mri.   Orders:  No orders of the defined types were placed in this encounter.  No orders of the defined types were placed in this encounter.     Procedures: No procedures performed   Clinical Data: No additional findings.   Subjective: Chief Complaint  Patient presents with   polyarthralgia    Follow up- review labwork    HPI 41 year old white female returns for recheck of her back pain.  States that she completed the prednisone taper and continues to have ongoing symptoms.  Pain again increased with walking, sitting, bending.  She completed formal PT. blood work drawn at patient's last office visit is not available for my review. Review of Systems No current cardiopulmonary GI/GU  Objective: Vital Signs: BP 127/81    Pulse 98    Ht 5' 2.5" (1.588 m)    Wt 226 lb (102.5 kg)    BMI 40.68 kg/m   Physical Exam HENT:     Head: Normocephalic.  Eyes:     Extraocular Movements: Extraocular movements intact.  Musculoskeletal:     Comments: Gait is somewhat antalgic.  Bilateral lumbar paraspinal tenderness/spasm.  Negative log about his.  No focal motor deficits.  Neurological:     Mental Status: She is alert and oriented to person, place, and time.   Psychiatric:        Mood and Affect: Mood normal.    Ortho Exam  Specialty Comments:  No specialty comments available.  Imaging: No results found.   PMFS History: Patient Active Problem List   Diagnosis Date Noted   Body mass index (BMI) 39.0-39.9, adult 05/23/2021   Fibromyalgia 05/03/2020   Chondromalacia of right patella 05/03/2020   Family history of systemic lupus erythematosus 09/16/2018   Hx of migraines 08/21/2018   Prediabetes 08/21/2018   Vitamin D deficiency 08/21/2018   History of carpal tunnel syndrome 08/21/2018   Past Medical History:  Diagnosis Date   Healthy adult    Migraine     Family History  Problem Relation Age of Onset   Hypertension Mother    Diabetes Mother    Asthma Mother    Asthma Father    Heart attack Father    Hypertension Brother    Heart attack Brother    Asthma Brother    Healthy Son    Healthy Daughter     Past Surgical History:  Procedure Laterality Date   ABLATION     CHOLECYSTECTOMY     TONSILLECTOMY     TUBAL LIGATION     Social History   Occupational History   Occupation: Schools  Tobacco  Use   Smoking status: Never   Smokeless tobacco: Never  Vaping Use   Vaping Use: Never used  Substance and Sexual Activity   Alcohol use: Yes    Comment: occ   Drug use: Never   Sexual activity: Not on file

## 2021-08-30 NOTE — Progress Notes (Signed)
Office Visit Note  Patient: [name]             Date of Birth: 1981-06-21           MRN: [id number]             PCP: Marda Stalker, PA-C Referring: Marda Stalker, PA-C Visit Date: 09/13/2021 Occupation: _0 @  Subjective:  Lower back pain   History of Present Illness: [name] is a 41 y.o. female with history of fibromyalgia.  She remains on Cymbalta 60 mg 1 capsule by mouth daily and methocarbamol 500 mg 1 tablet twice daily as needed for muscle spasms.  She has been taking methocarbamol very sparingly during flares.  She continues to experience intermittent myalgias and muscle tenderness due to fibromyalgia. She has been more sedentary recently due to discomfort in her lower back. She was evaluated at [facility] on 07/25/2021 and 08/29/21 for chronic lower back pain.  She completed physical therapy which has alleviated some of her discomfort.  Her mobility has improved but she continues to have stiffness in her lower back.  She is currently awaiting approval for an MRI for further evaluation.    Activities of Daily Living:  Patient reports morning stiffness for 20-30 minutes.   Patient Denies nocturnal pain.  Difficulty dressing/grooming: Reports Difficulty climbing stairs: Reports Difficulty getting out of chair: Reports Difficulty using hands for taps, buttons, cutlery, and/or writing: Reports  Review of Systems  Constitutional:  Positive for fatigue.  HENT:  Positive for mouth dryness. Negative for mouth sores and nose dryness.   Eyes:  Positive for dryness. Negative for pain and itching.  Respiratory:  Negative for shortness of breath and difficulty breathing.   Cardiovascular:  Negative for chest pain and palpitations.  Gastrointestinal:  Negative for blood in stool, constipation and diarrhea.  Endocrine: Positive for excessive thirst. Negative for increased urination.  Genitourinary:  Negative for difficulty urinating.  Musculoskeletal:  Positive for  myalgias, morning stiffness, muscle tenderness and myalgias. Negative for joint pain, joint pain and joint swelling.  Skin:  Negative for color change, rash and redness.  Allergic/Immunologic: Negative for susceptible to infections.  Neurological:  Positive for numbness and weakness. Negative for dizziness, headaches and memory loss.  Hematological:  Positive for bruising/bleeding tendency.  Psychiatric/Behavioral:  Negative for confusion.    PMFS History:  Patient Active Problem List   Diagnosis Date Noted   Body mass index (BMI) 39.0-39.9, adult 05/23/2021   Fibromyalgia 05/03/2020   Chondromalacia of right patella 05/03/2020   Family history of systemic lupus erythematosus 09/16/2018   Hx of migraines 08/21/2018   Prediabetes 08/21/2018   Vitamin D deficiency 08/21/2018   History of carpal tunnel syndrome 08/21/2018    Past Medical History:  Diagnosis Date   Healthy adult    Migraine     Family History  Problem Relation Age of Onset   Hypertension Mother    Diabetes Mother    Asthma Mother    Asthma Father    Heart attack Father    Hypertension Brother    Heart attack Brother    Asthma Brother    Healthy Son    Healthy Daughter    Past Surgical History:  Procedure Laterality Date   ABLATION     CHOLECYSTECTOMY     TONSILLECTOMY     TUBAL LIGATION     Social History   Social History Narrative   Lives with two children   Drinks caffeine every once in a while  Immunization History  Administered Date(s) Administered   Hepatitis B, adult 12/11/2017, 01/11/2018, 06/17/2018   Influenza Split 04/20/2013   PPD Test 12/11/2017   Tdap 10/01/2007, 09/22/2017     Objective: Vital Signs: BP 115/75 (BP Location: Left Arm, Patient Position: Sitting, Cuff Size: Large)    Pulse 98    Ht 5' 2.5" (1.588 m)    Wt 231 lb 3.2 oz (104.9 kg)    BMI 41.61 kg/m    Physical Exam Vitals and nursing note reviewed.  Constitutional:      Appearance: She is well-developed.   HENT:     Head: Normocephalic and atraumatic.  Eyes:     Conjunctiva/sclera: Conjunctivae normal.  Pulmonary:     Effort: Pulmonary effort is normal.  Abdominal:     Palpations: Abdomen is soft.  Musculoskeletal:     Cervical back: Normal range of motion.  Skin:    General: Skin is warm and dry.     Capillary Refill: Capillary refill takes less than 2 seconds.  Neurological:     Mental Status: She is alert and oriented to person, place, and time.  Psychiatric:        Behavior: Behavior normal.     Musculoskeletal Exam: C-spine has good range of motion.  Some midline spinal tenderness in the lumbar region.  Shoulder joints, elbow joints, wrist joints, MCPs, PIPs, and DIPs good ROM with no synovitis.  Complete fist formation bilaterally.  Hip joints, knee joints, and ankle joints good ROM with no discomfort.  No warmth or effusion of knee joints.  No tenderness or joint swelling of ankle joints.   CDAI Exam: CDAI Score: -- Patient Global: --; Provider Global: -- Swollen: --; Tender: -- Joint Exam 09/13/2021   No joint exam has been documented for this visit   There is currently no information documented on the homunculus. Go to the Rheumatology activity and complete the homunculus joint exam.  Investigation: No additional findings.  Imaging: No results found.  Recent Labs: Lab Results  Component Value Date   WBC 8.4 04/19/2021   HGB 12.4 04/19/2021   PLT 214 04/19/2021   NA 140 04/19/2021   K 4.5 04/19/2021   CL 104 04/19/2021   CO2 29 04/19/2021   GLUCOSE 115 (H) 04/19/2021   BUN 8 04/19/2021   CREATININE 0.64 04/19/2021   BILITOT 0.2 04/19/2021   ALKPHOS 55 09/25/2014   AST 13 04/19/2021   ALT 11 04/19/2021   PROT 6.8 04/19/2021   ALBUMIN 3.5 09/25/2014   CALCIUM 9.4 04/19/2021   GFRAA 131 01/07/2020    Speciality Comments: No specialty comments available.  Procedures:  No procedures performed Allergies: Pineapple, Tinidazole, Amoxicillin-pot  clavulanate, Other, and Biaxin [clarithromycin]   Assessment / Plan:     Visit Diagnoses: Fibromyalgia: She experiences intermittent myalgias and muscle tenderness due to fibromyalgia.  She has been more sedentary recently due to ongoing midline lower back pain.  The discomfort in her lower back as well as her mobility improved after completing physical therapy but she continues to have persistent discomfort and stiffness.  She is currently awaiting approval for an MRI of the lumbar spine for further evaluation.  She feels her fibromyalgia has been more active since she has had to be more sedentary recently.  She takes methocarbamol very sparingly for muscle spasms during flares.  She remains on Cymbalta 60 mg daily.  Discussed the importance of good sleep hygiene and regular exercise.  She will follow-up in the office in 6  months or sooner if needed.  Acute midline low back pain without sciatica -She continues to have ongoing discomfort in her lower back.  She is not experiencing any symptoms of sciatica at this time.  She has not had any recent falls or injuries.  She established care at [facility] and was referred to physical therapy which she has completed.  Lab work on 07/25/2021 was drawn but the specimens were lost.  We will check HLA-B27 and sed rate today.  Due to the ongoing discomfort and stiffness she is currently awaiting approval for a lumbar MRI. An updated temporary handicap placard was provided to the patient today.  Plan: HLA-B27 antigen  History of carpal tunnel syndrome - Bilateral-mild median nerve entrapment at wrist revealed on NCV with EMG on 05/16/21.   Chondromalacia of right patella: She has good ROM of the right knee joint.  No warmth or effusion noted.  Pain in both feet - X-rays of both feet obtained on 01/28/19 by Dr. Erlinda Hong. She is not experiencing any increased discomfort in her feet at this time.    Positive ANA (antinuclear antibody) - ANA 1:320 Nuclear, dense fine speckled,  Anti-CCP 37:  Smith negative and dsDNA negative on 01/07/20: She has no clinical features of autoimmune disease at this time.  She does have family history of systemic lupus.  The following labs will be updated today.  She was advised to notify us if she develops any new or worsening symptoms.- Plan: Anti-DNA antibody, double-stranded, C3 and C4, Sedimentation rate, ANA, CBC with Differential/Platelet, COMPLETE METABOLIC PANEL WITH GFR  Polyarthralgia -she continues to experience intermittent myalgias, arthralgias, and ongoing fatigue.  Lab work on 07/25/2021 [facility] but the specimens were lost.  The following lab work will be updated today.  Plan: Rheumatoid factor, Cyclic citrul peptide antibody, IgG, Uric acid  Positive anti-CCP test - She has a history of positive anti-CCP.  She has no clinical features of rheumatoid arthritis at this time.  The following lab work will be updated today.  Plan: Sedimentation rate, Rheumatoid factor, Cyclic citrul peptide antibody, IgG  Other medical conditions are listed as follows:   Prediabetes  Vitamin D deficiency  Hx of migraines  Family history of systemic lupus erythematosus - Plan: Anti-DNA antibody, double-stranded, C3 and C4, Sedimentation rate, ANA   Orders: Orders Placed This Encounter  Procedures   Anti-DNA antibody, double-stranded   C3 and C4   Sedimentation rate   ANA   CBC with Differential/Platelet   COMPLETE METABOLIC PANEL WITH GFR   Rheumatoid factor   Cyclic citrul peptide antibody, IgG   HLA-B27 antigen   Uric acid   No orders of the defined types were placed in this encounter.   Follow-Up Instructions: Return in about 6 months (around 03/16/2022) for Fibromyalgia.   Ofilia Neas, PA-C  Note - This record has been created using Dragon software.  Chart creation errors have been sought, but may not always  have been located. Such creation errors do not reflect on  the standard of medical care.

## 2021-09-13 ENCOUNTER — Other Ambulatory Visit: Payer: Self-pay

## 2021-09-13 ENCOUNTER — Ambulatory Visit: Payer: BC Managed Care – PPO | Admitting: Physician Assistant

## 2021-09-13 ENCOUNTER — Encounter: Payer: Self-pay | Admitting: Physician Assistant

## 2021-09-13 VITALS — BP 115/75 | HR 98 | Ht 62.5 in | Wt 231.2 lb

## 2021-09-13 DIAGNOSIS — E559 Vitamin D deficiency, unspecified: Secondary | ICD-10-CM

## 2021-09-13 DIAGNOSIS — M79671 Pain in right foot: Secondary | ICD-10-CM

## 2021-09-13 DIAGNOSIS — M2241 Chondromalacia patellae, right knee: Secondary | ICD-10-CM

## 2021-09-13 DIAGNOSIS — M545 Low back pain, unspecified: Secondary | ICD-10-CM

## 2021-09-13 DIAGNOSIS — M79672 Pain in left foot: Secondary | ICD-10-CM

## 2021-09-13 DIAGNOSIS — R7303 Prediabetes: Secondary | ICD-10-CM

## 2021-09-13 DIAGNOSIS — M797 Fibromyalgia: Secondary | ICD-10-CM

## 2021-09-13 DIAGNOSIS — M5442 Lumbago with sciatica, left side: Secondary | ICD-10-CM

## 2021-09-13 DIAGNOSIS — Z8269 Family history of other diseases of the musculoskeletal system and connective tissue: Secondary | ICD-10-CM

## 2021-09-13 DIAGNOSIS — M2550 Pain in unspecified joint: Secondary | ICD-10-CM

## 2021-09-13 DIAGNOSIS — Z8669 Personal history of other diseases of the nervous system and sense organs: Secondary | ICD-10-CM

## 2021-09-13 DIAGNOSIS — R768 Other specified abnormal immunological findings in serum: Secondary | ICD-10-CM

## 2021-09-14 NOTE — Progress Notes (Signed)
CBC stable. CMP WNL.  ESR remains elevated but is stable.  ?RF and anti-CCP negative.  Uric acid WNL.  Complements WNL. HLA-B27 negative.

## 2021-09-15 LAB — C3 AND C4
C3 Complement: 176 mg/dL (ref 83–193)
C4 Complement: 46 mg/dL (ref 15–57)

## 2021-09-15 LAB — COMPLETE METABOLIC PANEL WITH GFR
AG Ratio: 1.3 (calc) (ref 1.0–2.5)
ALT: 15 U/L (ref 6–29)
AST: 14 U/L (ref 10–30)
Albumin: 3.8 g/dL (ref 3.6–5.1)
Alkaline phosphatase (APISO): 66 U/L (ref 31–125)
BUN: 8 mg/dL (ref 7–25)
CO2: 29 mmol/L (ref 20–32)
Calcium: 9.4 mg/dL (ref 8.6–10.2)
Chloride: 105 mmol/L (ref 98–110)
Creat: 0.69 mg/dL (ref 0.50–0.99)
Globulin: 2.9 g/dL (calc) (ref 1.9–3.7)
Glucose, Bld: 91 mg/dL (ref 65–99)
Potassium: 4.7 mmol/L (ref 3.5–5.3)
Sodium: 141 mmol/L (ref 135–146)
Total Bilirubin: 0.2 mg/dL (ref 0.2–1.2)
Total Protein: 6.7 g/dL (ref 6.1–8.1)
eGFR: 111 mL/min/{1.73_m2} (ref >=60)

## 2021-09-15 LAB — SEDIMENTATION RATE: Sed Rate: 46 mm/h — ABNORMAL HIGH (ref 0–20)

## 2021-09-15 LAB — CBC WITH DIFFERENTIAL/PLATELET
Absolute Monocytes: 418 cells/uL (ref 200–950)
Basophils Absolute: 57 cells/uL (ref 0–200)
Basophils Relative: 0.7 %
Eosinophils Absolute: 197 cells/uL (ref 15–500)
Eosinophils Relative: 2.4 %
HCT: 39.3 % (ref 35.0–45.0)
Hemoglobin: 12.6 g/dL (ref 11.7–15.5)
Lymphs Abs: 2993 cells/uL (ref 850–3900)
MCH: 25.9 pg — ABNORMAL LOW (ref 27.0–33.0)
MCHC: 32.1 g/dL (ref 32.0–36.0)
MCV: 80.9 fL (ref 80.0–100.0)
MPV: 11 fL (ref 7.5–12.5)
Monocytes Relative: 5.1 %
Neutro Abs: 4535 cells/uL (ref 1500–7800)
Neutrophils Relative %: 55.3 %
Platelets: 243 10*3/uL (ref 140–400)
RBC: 4.86 10*6/uL (ref 3.80–5.10)
RDW: 14.4 % (ref 11.0–15.0)
Total Lymphocyte: 36.5 %
WBC: 8.2 10*3/uL (ref 3.8–10.8)

## 2021-09-15 LAB — ANTI-NUCLEAR AB-TITER (ANA TITER)
ANA TITER: 1:320 {titer} — ABNORMAL HIGH
ANA Titer 1: 1:320 {titer} — ABNORMAL HIGH

## 2021-09-15 LAB — ANA: Anti Nuclear Antibody (ANA): POSITIVE — AB

## 2021-09-15 LAB — CYCLIC CITRUL PEPTIDE ANTIBODY, IGG: Cyclic Citrullin Peptide Ab: <16 UNITS

## 2021-09-15 LAB — HLA-B27 ANTIGEN: HLA-B27 Antigen: NEGATIVE

## 2021-09-15 LAB — URIC ACID: Uric Acid, Serum: 4.5 mg/dL (ref 2.5–7.0)

## 2021-09-15 LAB — RHEUMATOID FACTOR: Rheumatoid fact SerPl-aCnc: <14 IU/mL (ref <14)

## 2021-09-15 LAB — ANTI-DNA ANTIBODY, DOUBLE-STRANDED: ds DNA Ab: <1 IU/mL

## 2021-09-20 ENCOUNTER — Telehealth: Payer: Self-pay | Admitting: *Deleted

## 2021-09-20 NOTE — Telephone Encounter (Signed)
Pt called wanting to know when she will be scheduled for her MRI, I checked on the MRI and looks like Zonia Kief had placed the order and then same day canceled the order. I do not know what this was about, I tried calling Fayrene Fearing to see if can get explanation no answer.  ?

## 2021-09-21 ENCOUNTER — Other Ambulatory Visit: Payer: Self-pay | Admitting: Surgery

## 2021-09-21 DIAGNOSIS — M545 Low back pain, unspecified: Secondary | ICD-10-CM

## 2021-09-22 ENCOUNTER — Ambulatory Visit
Admission: RE | Admit: 2021-09-22 | Discharge: 2021-09-22 | Disposition: A | Discharge status: Home / Self Care | Payer: BC Managed Care – PPO | Source: Ambulatory Visit | Attending: Surgery | Admitting: Surgery

## 2021-09-22 ENCOUNTER — Other Ambulatory Visit: Payer: Self-pay

## 2021-09-22 DIAGNOSIS — M545 Low back pain, unspecified: Secondary | ICD-10-CM

## 2021-09-24 ENCOUNTER — Other Ambulatory Visit: Payer: Self-pay | Admitting: Obstetrics and Gynecology

## 2021-09-24 ENCOUNTER — Other Ambulatory Visit: Payer: BC Managed Care – PPO

## 2021-09-24 ENCOUNTER — Other Ambulatory Visit: Payer: Self-pay | Admitting: *Deleted

## 2021-09-24 DIAGNOSIS — I200 Unstable angina: Secondary | ICD-10-CM

## 2021-09-26 ENCOUNTER — Other Ambulatory Visit: Payer: BC Managed Care – PPO

## 2021-09-26 ENCOUNTER — Ambulatory Visit (INDEPENDENT_AMBULATORY_CARE_PROVIDER_SITE_OTHER): Payer: BC Managed Care – PPO | Admitting: Surgery

## 2021-09-26 DIAGNOSIS — M545 Low back pain, unspecified: Secondary | ICD-10-CM

## 2021-10-05 NOTE — Progress Notes (Signed)
? ?Office Visit Note ?  ?Patient: [name]           ?Date of Birth: 1981/04/08           ?MRN: [id number] ?Visit Date: 09/26/2021 ?             ?Requested by: Lauren Soho, PA-C ?[address] ?[place],  [place] [address] ?PCP: Lauren Soho, PA-C ? ? ?Assessment & Plan: ?Visit Diagnoses:  ?1. Acute midline low back pain without sciatica   ? ? ?Plan: I will have patient follow-up with Dr. Ophelia Willis in 6 weeks for recheck again to review lumbar MRI and discuss any further treatment options.  Patient return sooner if needed. ? ?Follow-Up Instructions: Return in about 6 weeks (around 11/07/2021) for recheck lumbar and review mri.  discuss options.  ? ?Orders:  ?No orders of the defined types were placed in this encounter. ? ?No orders of the defined types were placed in this encounter. ? ? ? ? Procedures: ?No procedures performed ? ? ?Clinical Data: ?No additional findings. ? ? ?Subjective: ?Chief Complaint  ?Patient presents with  ? Lower Back - Follow-up  ? ? ?HPI ?41 year old black female returns for review of lumbar MRI.  There was a mixup with patient's appointment and she was supposed to be scheduled to see Dr. Ophelia Willis for review.  Study from September 22, 2021 showed: ? ? ?CLINICAL DATA:  Initial evaluation for low back pain since December ?of 2022. ?  ?EXAM: ?MRI LUMBAR SPINE WITHOUT CONTRAST ?  ?TECHNIQUE: ?Multiplanar, multisequence MR imaging of the lumbar spine was ?performed. No intravenous contrast was administered. ?  ?COMPARISON:  Prior radiograph from 07/18/2021. ?  ?FINDINGS: ?Segmentation: Standard. Lowest well-formed disc space labeled the ?L5-S1 level. ?  ?Alignment: Physiologic with preservation of the normal lumbar ?lordosis. No listhesis. ?  ?Vertebrae: Vertebral body height maintained without acute or chronic ?fracture. Bone marrow signal intensity somewhat heterogeneous but ?overall within normal limits. Few small benign hemangiomata noted. ?No worrisome osseous lesions or abnormal marrow  edema. ?  ?Conus medullaris and cauda equina: Conus extends to the T12-L1 ?level. Conus and cauda equina appear normal. ?  ?Paraspinal and other soft tissues: Unremarkable. ?  ?Disc levels: ?  ?T11-12: Seen only on sagittal projection. Small right paracentral ?disc protrusion indents the right ventral thecal sac (series 3, ?image 7). No significant spinal stenosis. Foramina remain patent. ?  ?T12-L1: Unremarkable. ?  ?L1-2:  Unremarkable. ?  ?L2-3:  Unremarkable. ?  ?L3-4: Negative interspace. Mild right-sided facet hypertrophy. No ?canal or foraminal stenosis. ?  ?L4-5: Mild far lateral disc bulging. Mild bilateral facet ?hypertrophy. No canal or foraminal stenosis. ?  ?L5-S1: Tiny central disc extrusion with slight superior migration ?(series 7, image 30). Mild facet hypertrophy. No canal or lateral ?recess stenosis. Foramina remain patent. No impingement. ?  ?IMPRESSION: ?1. Tiny central disc extrusion with superior migration at L5-S1 ?without stenosis or neural impingement. ?2. Small right paracentral disc protrusion at T11-12 without ?stenosis. ?3. Mild bilateral facet hypertrophy at L3-4 through L5-S1. ?  ?  ?Electronically Signed ?  By: Rise Mu M.D. ?  On: 09/22/2021 23:52 ? ?Patient states that currently she is about 80% better.  Still continues to have left-sided low back pain. ? ?Objective: ?Vital Signs: There were no vitals taken for this visit. ? ?Physical Exam ?Pleasant female alert and oriented in no acute stress. ?Ortho Exam ? ?Specialty Comments:  ?No specialty comments available. ? ?Imaging: ?No results found. ? ? ?PMFS History: ?  Patient Active Problem List  ? Diagnosis Date Noted  ? Body mass index (BMI) 39.0-39.9, adult 05/23/2021  ? Fibromyalgia 05/03/2020  ? Chondromalacia of right patella 05/03/2020  ? Family history of systemic lupus erythematosus 09/16/2018  ? Hx of migraines 08/21/2018  ? Prediabetes 08/21/2018  ? Vitamin D deficiency 08/21/2018  ? History of carpal tunnel  syndrome 08/21/2018  ? ?Past Medical History:  ?Diagnosis Date  ? Healthy adult   ? Migraine   ?  ?Family History  ?Problem Relation Age of Onset  ? Hypertension Mother   ? Diabetes Mother   ? Asthma Mother   ? Asthma Father   ? Heart attack Father   ? Hypertension Brother   ? Heart attack Brother   ? Asthma Brother   ? Healthy Son   ? Healthy Daughter   ?  ?Past Surgical History:  ?Procedure Laterality Date  ? ABLATION    ? CHOLECYSTECTOMY    ? TONSILLECTOMY    ? TUBAL LIGATION    ? ?Social History  ? ?Occupational History  ? Occupation: Schools  ?Tobacco Use  ? Smoking status: Never  ?  Passive exposure: Never  ? Smokeless tobacco: Never  ?Vaping Use  ? Vaping Use: Never used  ?Substance and Sexual Activity  ? Alcohol use: Yes  ?  Comment: occ  ? Drug use: Never  ? Sexual activity: Not on file  ? ? ? ? ? ? ?

## 2021-10-18 ENCOUNTER — Ambulatory Visit: Payer: BC Managed Care – PPO | Admitting: Physician Assistant

## 2021-11-07 ENCOUNTER — Ambulatory Visit: Payer: BC Managed Care – PPO | Admitting: Orthopaedic Surgery

## 2021-11-13 ENCOUNTER — Encounter: Payer: Self-pay | Admitting: Orthopaedic Surgery

## 2021-11-13 ENCOUNTER — Ambulatory Visit: Payer: BC Managed Care – PPO | Admitting: Orthopaedic Surgery

## 2021-11-13 VITALS — BP 100/67 | HR 92 | Ht 62.5 in | Wt 234.0 lb

## 2021-11-13 DIAGNOSIS — M5126 Other intervertebral disc displacement, lumbar region: Secondary | ICD-10-CM | POA: Diagnosis not present

## 2021-11-13 NOTE — Progress Notes (Signed)
? ?Office Visit Note ?  ?Patient: [name]           ?Date of Birth: January 28, 1981           ?MRN: [id number] ?Visit Date: 11/13/2021 ?             ?Requested by: Jarrett Soho, PA-C ?[address] ?[place],  [place] [address] ?PCP: Jarrett Soho, PA-C ? ? ?Assessment & Plan: ?Visit Diagnoses: No diagnosis found. ? ?Plan: MRI scan reviewed.  She has tiny disc extrusion superior migration L5-S1 without compression.  Minimal posterior protrusion T11-T12 without compression.  Mild facet changes last 3 lumbar levels.  We will set up for single epidural injection at L5-S1.  She can follow-up if she has persistent problems.  We discussed weight loss to help unload her back as well with her BMI of 42. ? ?Follow-Up Instructions: No follow-ups on file.  ? ?Orders:  ?No orders of the defined types were placed in this encounter. ? ?No orders of the defined types were placed in this encounter. ? ? ? ? Procedures: ?No procedures performed ? ? ?Clinical Data: ?No additional findings. ? ? ?Subjective: ?Chief Complaint  ?Patient presents with  ? Lower Back - Pain  ? ? ?HPI 41 year old female returns for recheck lumbar pain with MRI review.  History of fibromyalgia last seen in Dr. Corliss Skains on 09/13/2021.  Patient states she continues to have back pain all the time cannot stand too long has difficulty walking.  She is taken muscle relaxants in the past previous prednisone Dosepak which helped for a period of time and then wore off.  She does have prediabetes. ? ?Review of Systems all systems noncontributory HPI ? ? ?Objective: ?Vital Signs: BP 100/67   Pulse 92   Ht 5' 2.5" (1.588 m)   Wt 234 lb (106.1 kg)   BMI 42.12 kg/m?  ? ?Physical Exam ?Constitutional:   ?   Appearance: She is well-developed.  ?HENT:  ?   Head: Normocephalic.  ?   Right Ear: External ear normal.  ?   Left Ear: External ear normal. There is no impacted cerumen.  ?Eyes:  ?   Pupils: Pupils are equal, round, and reactive to light.  ?Neck:  ?    Thyroid: No thyromegaly.  ?   Trachea: No tracheal deviation.  ?Cardiovascular:  ?   Rate and Rhythm: Normal rate.  ?Pulmonary:  ?   Effort: Pulmonary effort is normal.  ?Abdominal:  ?   Palpations: Abdomen is soft.  ?Musculoskeletal:  ?   Cervical back: No rigidity.  ?Skin: ?   General: Skin is warm and dry.  ?Neurological:  ?   Mental Status: She is alert and oriented to person, place, and time.  ?Psychiatric:     ?   Behavior: Behavior normal.  ? ? ?Ortho Exam patient has some sciatic notch tenderness right and left minimal trochanteric bursal tenderness.  Knee and ankle jerk are intact.  Anterior tib gastrocsoleus is intact.  Negative popliteal compression test.  Negative logroll hips. ? ?Specialty Comments:  ?No specialty comments available. ? ?Imaging: ?Narrative & Impression  ?CLINICAL DATA:  Initial evaluation for low back pain since December ?of 2022. ?  ?EXAM: ?MRI LUMBAR SPINE WITHOUT CONTRAST ?  ?TECHNIQUE: ?Multiplanar, multisequence MR imaging of the lumbar spine was ?performed. No intravenous contrast was administered. ?  ?COMPARISON:  Prior radiograph from 07/18/2021. ?  ?FINDINGS: ?Segmentation: Standard. Lowest well-formed disc space labeled the ?L5-S1 level. ?  ?Alignment: Physiologic with  preservation of the normal lumbar ?lordosis. No listhesis. ?  ?Vertebrae: Vertebral body height maintained without acute or chronic ?fracture. Bone marrow signal intensity somewhat heterogeneous but ?overall within normal limits. Few small benign hemangiomata noted. ?No worrisome osseous lesions or abnormal marrow edema. ?  ?Conus medullaris and cauda equina: Conus extends to the T12-L1 ?level. Conus and cauda equina appear normal. ?  ?Paraspinal and other soft tissues: Unremarkable. ?  ?Disc levels: ?  ?T11-12: Seen only on sagittal projection. Small right paracentral ?disc protrusion indents the right ventral thecal sac (series 3, ?image 7). No significant spinal stenosis. Foramina remain patent. ?  ?T12-L1:  Unremarkable. ?  ?L1-2:  Unremarkable. ?  ?L2-3:  Unremarkable. ?  ?L3-4: Negative interspace. Mild right-sided facet hypertrophy. No ?canal or foraminal stenosis. ?  ?L4-5: Mild far lateral disc bulging. Mild bilateral facet ?hypertrophy. No canal or foraminal stenosis. ?  ?L5-S1: Tiny central disc extrusion with slight superior migration ?(series 7, image 30). Mild facet hypertrophy. No canal or lateral ?recess stenosis. Foramina remain patent. No impingement. ?  ?IMPRESSION: ?1. Tiny central disc extrusion with superior migration at L5-S1 ?without stenosis or neural impingement. ?2. Small right paracentral disc protrusion at T11-12 without ?stenosis. ?3. Mild bilateral facet hypertrophy at L3-4 through L5-S1. ?  ?  ?Electronically Signed ?  By: Rise Mu M.D. ?  On: 09/22/2021 23:52 ?   ? ? ? ?PMFS History: ?Patient Active Problem List  ? Diagnosis Date Noted  ? Body mass index (BMI) 39.0-39.9, adult 05/23/2021  ? Fibromyalgia 05/03/2020  ? Chondromalacia of right patella 05/03/2020  ? Family history of systemic lupus erythematosus 09/16/2018  ? Hx of migraines 08/21/2018  ? Prediabetes 08/21/2018  ? Vitamin D deficiency 08/21/2018  ? History of carpal tunnel syndrome 08/21/2018  ? ?Past Medical History:  ?Diagnosis Date  ? Healthy adult   ? Migraine   ?  ?Family History  ?Problem Relation Age of Onset  ? Hypertension Mother   ? Diabetes Mother   ? Asthma Mother   ? Asthma Father   ? Heart attack Father   ? Hypertension Brother   ? Heart attack Brother   ? Asthma Brother   ? Healthy Son   ? Healthy Daughter   ?  ?Past Surgical History:  ?Procedure Laterality Date  ? ABLATION    ? CHOLECYSTECTOMY    ? TONSILLECTOMY    ? TUBAL LIGATION    ? ?Social History  ? ?Occupational History  ? Occupation: Schools  ?Tobacco Use  ? Smoking status: Never  ?  Passive exposure: Never  ? Smokeless tobacco: Never  ?Vaping Use  ? Vaping Use: Never used  ?Substance and Sexual Activity  ? Alcohol use: Yes  ?  Comment: occ   ? Drug use: Never  ? Sexual activity: Not on file  ? ? ? ? ? ? ?

## 2021-11-17 ENCOUNTER — Other Ambulatory Visit: Payer: Self-pay | Admitting: Physician Assistant

## 2021-11-19 NOTE — Telephone Encounter (Signed)
Next Visit: 03/20/2022 ? ?Last Visit: 09/13/2021 ? ?Last Fill: 07/18/2021 ? ?Dx: Fibromyalgia ? ?Current Dose per office note on 09/13/2021: Cymbalta 60 mg daily. ? ?Okay to refill Cymbalta?   ?

## 2021-12-18 ENCOUNTER — Ambulatory Visit: Payer: Self-pay

## 2021-12-18 ENCOUNTER — Encounter: Payer: Self-pay | Admitting: Physical Medicine and Rehabilitation

## 2021-12-18 ENCOUNTER — Ambulatory Visit (INDEPENDENT_AMBULATORY_CARE_PROVIDER_SITE_OTHER): Payer: BC Managed Care – PPO | Admitting: Physical Medicine and Rehabilitation

## 2021-12-18 DIAGNOSIS — M5416 Radiculopathy, lumbar region: Secondary | ICD-10-CM

## 2021-12-18 MED ORDER — METHYLPREDNISOLONE ACETATE 80 MG/ML IJ SUSP
80.0000 mg | Freq: Once | INTRAMUSCULAR | Status: AC
  Administered 2021-12-18: 80 mg

## 2021-12-18 NOTE — Patient Instructions (Signed)

## 2021-12-18 NOTE — Progress Notes (Unsigned)
Pt state lower back pain. Pt state walking, standing and laying down makes the pain worse. Pt state she uses pain cream to help ease her pain.  Numeric Pain Rating Scale and Functional Assessment Average Pain 5   In the last MONTH (on 0-10 scale) has pain interfered with the following?  1. General activity like being  able to carry out your everyday physical activities such as walking, climbing stairs, carrying groceries, or moving a chair?  Rating(10)   +Driver, -BT, -Dye Allergies.

## 2021-12-19 ENCOUNTER — Encounter: Payer: Self-pay | Admitting: Physical Medicine and Rehabilitation

## 2022-01-04 NOTE — Progress Notes (Signed)
[name] - 41 y.o. female MRN [id number]  Date of birth: 1981/06/01  Office Visit Note: Visit Date: 12/18/2021 PCP: Jarrett Soho, PA-C Referred by: Jarrett Soho, PA-C  Subjective: Chief Complaint  Patient presents with   Lower Back - Pain   HPI:  [name] is a 41 y.o. female who comes in today at the request of Dr. Annell Greening for planned Right L5-S1 Lumbar Interlaminar epidural steroid injection with fluoroscopic guidance.  The patient has failed conservative care including home exercise, medications, time and activity modification.  This injection will be diagnostic and hopefully therapeutic.  Please see requesting physician notes for further details and justification.   ROS Otherwise per HPI.  Assessment & Plan: Visit Diagnoses:    ICD-10-CM   1. Lumbar radiculopathy  M54.16 XR C-ARM NO REPORT    Epidural Steroid injection    methylPREDNISolone acetate (DEPO-MEDROL) injection 80 mg      Plan: No additional findings.   Meds & Orders:  Meds ordered this encounter  Medications   methylPREDNISolone acetate (DEPO-MEDROL) injection 80 mg    Orders Placed This Encounter  Procedures   XR C-ARM NO REPORT   Epidural Steroid injection    Follow-up: Return for visit to requesting provider as needed.   Procedures: No procedures performed  Lumbar Epidural Steroid Injection - Interlaminar Approach with Fluoroscopic Guidance  Patient: [name]      Date of Birth: 03/23/1981 MRN: [id number] PCP: Jarrett Soho, PA-C      Visit Date: 12/18/2021   Universal Protocol:     Consent Given By: the patient  Position: PRONE  Additional Comments: Vital signs were monitored before and after the procedure. Patient was prepped and draped in the usual sterile fashion. The correct patient, procedure, and site was verified.   Injection Procedure Details:   Procedure diagnoses: Lumbar radiculopathy [M54.16]   Meds Administered:  Meds ordered this  encounter  Medications   methylPREDNISolone acetate (DEPO-MEDROL) injection 80 mg     Laterality: Right  Location/Site:  L5-S1  Needle: 3.5 in., 20 ga. Tuohy  Needle Placement: Paramedian epidural  Findings:   -Comments: Excellent flow of contrast into the epidural space.  Procedure Details: Using a paramedian approach from the side mentioned above, the region overlying the inferior lamina was localized under fluoroscopic visualization and the soft tissues overlying this structure were infiltrated with 4 ml. of 1% Lidocaine without Epinephrine. The Tuohy needle was inserted into the epidural space using a paramedian approach.   The epidural space was localized using loss of resistance along with counter oblique bi-planar fluoroscopic views.  After negative aspirate for air, blood, and CSF, a 2 ml. volume of Isovue-250 was injected into the epidural space and the flow of contrast was observed. Radiographs were obtained for documentation purposes.    The injectate was administered into the level noted above.   Additional Comments:  The patient tolerated the procedure well Dressing: 2 x 2 sterile gauze and Band-Aid    Post-procedure details: Patient was observed during the procedure. Post-procedure instructions were reviewed.  Patient left the clinic in stable condition.   Clinical History: MRI LUMBAR SPINE WITHOUT CONTRAST   TECHNIQUE: Multiplanar, multisequence MR imaging of the lumbar spine was performed. No intravenous contrast was administered.   COMPARISON:  Prior radiograph from 07/18/2021.   FINDINGS: Segmentation: Standard. Lowest well-formed disc space labeled the L5-S1 level.   Alignment: Physiologic with preservation of the normal lumbar lordosis. No listhesis.   Vertebrae: Vertebral body height maintained  without acute or chronic fracture. Bone marrow signal intensity somewhat heterogeneous but overall within normal limits. Few small benign hemangiomata  noted. No worrisome osseous lesions or abnormal marrow edema.   Conus medullaris and cauda equina: Conus extends to the T12-L1 level. Conus and cauda equina appear normal.   Paraspinal and other soft tissues: Unremarkable.   Disc levels:   T11-12: Seen only on sagittal projection. Small right paracentral disc protrusion indents the right ventral thecal sac (series 3, image 7). No significant spinal stenosis. Foramina remain patent.   T12-L1: Unremarkable.   L1-2:  Unremarkable.   L2-3:  Unremarkable.   L3-4: Negative interspace. Mild right-sided facet hypertrophy. No canal or foraminal stenosis.   L4-5: Mild far lateral disc bulging. Mild bilateral facet hypertrophy. No canal or foraminal stenosis.   L5-S1: Tiny central disc extrusion with slight superior migration (series 7, image 30). Mild facet hypertrophy. No canal or lateral recess stenosis. Foramina remain patent. No impingement.   IMPRESSION: 1. Tiny central disc extrusion with superior migration at L5-S1 without stenosis or neural impingement. 2. Small right paracentral disc protrusion at T11-12 without stenosis. 3. Mild bilateral facet hypertrophy at L3-4 through L5-S1.     Electronically Signed   By: Rise Mu M.D.   On: 09/22/2021 23:52     Objective:  VS:  HT:    WT:   BMI:     BP:   HR: bpm  TEMP: ( )  RESP:  Physical Exam Vitals and nursing note reviewed.  Constitutional:      General: She is not in acute distress.    Appearance: Normal appearance. She is obese. She is not ill-appearing.  HENT:     Head: Normocephalic and atraumatic.     Right Ear: External ear normal.     Left Ear: External ear normal.  Eyes:     Extraocular Movements: Extraocular movements intact.  Cardiovascular:     Rate and Rhythm: Normal rate.     Pulses: Normal pulses.  Pulmonary:     Effort: Pulmonary effort is normal. No respiratory distress.  Abdominal:     General: There is no distension.      Palpations: Abdomen is soft.  Musculoskeletal:        General: Tenderness present.     Cervical back: Neck supple.     Right lower leg: No edema.     Left lower leg: No edema.     Comments: Patient has good distal strength with no pain over the greater trochanters.  No clonus or focal weakness.  Skin:    Findings: No erythema, lesion or rash.  Neurological:     General: No focal deficit present.     Mental Status: She is alert and oriented to person, place, and time.     Sensory: No sensory deficit.     Motor: No weakness or abnormal muscle tone.     Coordination: Coordination normal.  Psychiatric:        Mood and Affect: Mood normal.        Behavior: Behavior normal.      Imaging: No results found.

## 2022-01-16 ENCOUNTER — Ambulatory Visit: Payer: BC Managed Care – PPO | Admitting: Orthopaedic Surgery

## 2022-01-16 DIAGNOSIS — M5126 Other intervertebral disc displacement, lumbar region: Secondary | ICD-10-CM

## 2022-01-16 NOTE — Progress Notes (Unsigned)
Office Visit Note   Patient: [name]           Date of Birth: [birth year]           MRN: [id number] Visit Date: 01/16/2022              Requested by: Jarrett Soho, PA-C [address] [place],  [place] [address] PCP: Jarrett Soho, PA-C   Assessment & Plan: Visit Diagnoses: No diagnosis found.  Plan: ***  Follow-Up Instructions: No follow-ups on file.   Orders:  No orders of the defined types were placed in this encounter.  No orders of the defined types were placed in this encounter.     Procedures: No procedures performed   Clinical Data: No additional findings.   Subjective: Chief Complaint  Patient presents with   Lower Back - Follow-up    HPI  Review of Systems   Objective: Vital Signs: BP 134/82   Pulse (!) 101   Physical Exam  Ortho Exam  Specialty Comments:  MRI LUMBAR SPINE WITHOUT CONTRAST   TECHNIQUE: Multiplanar, multisequence MR imaging of the lumbar spine was performed. No intravenous contrast was administered.   COMPARISON:  Prior radiograph from 07/18/2021.   FINDINGS: Segmentation: Standard. Lowest well-formed disc space labeled the L5-S1 level.   Alignment: Physiologic with preservation of the normal lumbar lordosis. No listhesis.   Vertebrae: Vertebral body height maintained without acute or chronic fracture. Bone marrow signal intensity somewhat heterogeneous but overall within normal limits. Few small benign hemangiomata noted. No worrisome osseous lesions or abnormal marrow edema.   Conus medullaris and cauda equina: Conus extends to the T12-L1 level. Conus and cauda equina appear normal.   Paraspinal and other soft tissues: Unremarkable.   Disc levels:   T11-12: Seen only on sagittal projection. Small right paracentral disc protrusion indents the right ventral thecal sac (series 3, image 7). No significant spinal stenosis. Foramina remain patent.   T12-L1: Unremarkable.   L1-2:   Unremarkable.   L2-3:  Unremarkable.   L3-4: Negative interspace. Mild right-sided facet hypertrophy. No canal or foraminal stenosis.   L4-5: Mild far lateral disc bulging. Mild bilateral facet hypertrophy. No canal or foraminal stenosis.   L5-S1: Tiny central disc extrusion with slight superior migration (series 7, image 30). Mild facet hypertrophy. No canal or lateral recess stenosis. Foramina remain patent. No impingement.   IMPRESSION: 1. Tiny central disc extrusion with superior migration at L5-S1 without stenosis or neural impingement. 2. Small right paracentral disc protrusion at T11-12 without stenosis. 3. Mild bilateral facet hypertrophy at L3-4 through L5-S1.     Electronically Signed   By: Rise Mu M.D.   On: 09/22/2021 23:52  Imaging: No results found.   PMFS History: Patient Active Problem List   Diagnosis Date Noted   Body mass index (BMI) 39.0-39.9, adult 05/23/2021   Fibromyalgia 05/03/2020   Chondromalacia of right patella 05/03/2020   Family history of systemic lupus erythematosus 09/16/2018   Hx of migraines 08/21/2018   Prediabetes 08/21/2018   Vitamin D deficiency 08/21/2018   History of carpal tunnel syndrome 08/21/2018   Past Medical History:  Diagnosis Date   Healthy adult    Migraine     Family History  Problem Relation Age of Onset   Hypertension Mother    Diabetes Mother    Asthma Mother    Asthma Father    Heart attack Father    Hypertension Brother    Heart attack Brother    Asthma Brother  Healthy Son    Healthy Daughter     Past Surgical History:  Procedure Laterality Date   ABLATION     CHOLECYSTECTOMY     TONSILLECTOMY     TUBAL LIGATION     Social History   Occupational History   Occupation: Schools  Tobacco Use   Smoking status: Never    Passive exposure: Never   Smokeless tobacco: Never  Vaping Use   Vaping Use: Never used  Substance and Sexual Activity   Alcohol use: Yes    Comment: occ    Drug use: Never   Sexual activity: Not on file

## 2022-01-17 ENCOUNTER — Ambulatory Visit: Payer: BC Managed Care – PPO | Admitting: Rheumatology

## 2022-01-17 DIAGNOSIS — M5126 Other intervertebral disc displacement, lumbar region: Secondary | ICD-10-CM | POA: Insufficient documentation

## 2022-03-06 NOTE — Progress Notes (Deleted)
Office Visit Note  Patient: [name]             Date of Birth: [birth year]           MRN: [id number]             PCP: Jarrett Soho, PA-C Referring: Jarrett Soho, PA-C Visit Date: 03/20/2022 Occupation: @GUAROCC @  Subjective:  No chief complaint on file.   History of Present Illness: [name] is a 41 y.o. female ***   Activities of Daily Living:  Patient reports morning stiffness for *** {minute/hour:19697}.   Patient {ACTIONS;DENIES/REPORTS:21021675::"Denies"} nocturnal pain.  Difficulty dressing/grooming: {ACTIONS;DENIES/REPORTS:21021675::"Denies"} Difficulty climbing stairs: {ACTIONS;DENIES/REPORTS:21021675::"Denies"} Difficulty getting out of chair: {ACTIONS;DENIES/REPORTS:21021675::"Denies"} Difficulty using hands for taps, buttons, cutlery, and/or writing: {ACTIONS;DENIES/REPORTS:21021675::"Denies"}  No Rheumatology ROS completed.   PMFS History:  Patient Active Problem List   Diagnosis Date Noted  . Protrusion of lumbar intervertebral disc 01/17/2022  . Body mass index (BMI) 39.0-39.9, adult 05/23/2021  . Fibromyalgia 05/03/2020  . Chondromalacia of right patella 05/03/2020  . Family history of systemic lupus erythematosus 09/16/2018  . Hx of migraines 08/21/2018  . Prediabetes 08/21/2018  . Vitamin D deficiency 08/21/2018  . History of carpal tunnel syndrome 08/21/2018    Past Medical History:  Diagnosis Date  . Healthy adult   . Migraine     Family History  Problem Relation Age of Onset  . Hypertension Mother   . Diabetes Mother   . Asthma Mother   . Asthma Father   . Heart attack Father   . Hypertension Brother   . Heart attack Brother   . Asthma Brother   . Healthy Son   . Healthy Daughter    Past Surgical History:  Procedure Laterality Date  . ABLATION    . CHOLECYSTECTOMY    . TONSILLECTOMY    . TUBAL LIGATION     Social History   Social History Narrative   Lives with two children   Drinks caffeine every once in a  while    Immunization History  Administered Date(s) Administered  . Hepatitis B, adult 12/11/2017, 01/11/2018, 06/17/2018  . Influenza Split 04/20/2013  . PPD Test 12/11/2017  . Tdap 10/01/2007, 09/22/2017     Objective: Vital Signs: There were no vitals taken for this visit.   Physical Exam   Musculoskeletal Exam: ***  CDAI Exam: CDAI Score: -- Patient Global: --; Provider Global: -- Swollen: --; Tender: -- Joint Exam 03/20/2022   No joint exam has been documented for this visit   There is currently no information documented on the homunculus. Go to the Rheumatology activity and complete the homunculus joint exam.  Investigation: No additional findings.  Imaging: No results found.  Recent Labs: Lab Results  Component Value Date   WBC 8.2 09/13/2021   HGB 12.6 09/13/2021   PLT 243 09/13/2021   NA 141 09/13/2021   K 4.7 09/13/2021   CL 105 09/13/2021   CO2 29 09/13/2021   GLUCOSE 91 09/13/2021   BUN 8 09/13/2021   CREATININE 0.69 09/13/2021   BILITOT 0.2 09/13/2021   ALKPHOS 55 09/25/2014   AST 14 09/13/2021   ALT 15 09/13/2021   PROT 6.7 09/13/2021   ALBUMIN 3.5 09/25/2014   CALCIUM 9.4 09/13/2021   GFRAA 131 01/07/2020    Speciality Comments: No specialty comments available.  Procedures:  No procedures performed Allergies: Pineapple, Tinidazole, Amoxicillin-pot clavulanate, Other, and Biaxin [clarithromycin]   Assessment / Plan:     Visit Diagnoses: No diagnosis found.  Orders:  No orders of the defined types were placed in this encounter.  No orders of the defined types were placed in this encounter.   Face-to-face time spent with patient was *** minutes. Greater than 50% of time was spent in counseling and coordination of care.  Follow-Up Instructions: No follow-ups on file.   Ellen Henri, CMA  Note - This record has been created using Animal nutritionist.  Chart creation errors have been sought, but may not always  have been located.  Such creation errors do not reflect on  the standard of medical care.

## 2022-03-13 ENCOUNTER — Telehealth: Payer: Self-pay | Admitting: Rheumatology

## 2022-03-13 NOTE — Telephone Encounter (Signed)
I called patient, handicap placard at front desk for patient to pick up at her convenience.

## 2022-03-13 NOTE — Telephone Encounter (Signed)
Patient called the office requesting to have her handicap placard renewed. Patient states hers expires tomorrow.

## 2022-03-15 ENCOUNTER — Other Ambulatory Visit: Payer: Self-pay | Admitting: Physician Assistant

## 2022-03-15 NOTE — Telephone Encounter (Signed)
Next Visit: 03/20/2022   Last Visit: 09/13/2021   Last Fill: 11/19/2021   Dx: Fibromyalgia   Current Dose per office note on 09/13/2021: Cymbalta 60 mg daily.   Okay to refill Cymbalta?

## 2022-03-20 ENCOUNTER — Ambulatory Visit: Payer: Self-pay | Admitting: Rheumatology

## 2022-03-20 DIAGNOSIS — Z8669 Personal history of other diseases of the nervous system and sense organs: Secondary | ICD-10-CM

## 2022-03-20 DIAGNOSIS — R768 Other specified abnormal immunological findings in serum: Secondary | ICD-10-CM

## 2022-03-20 DIAGNOSIS — R7303 Prediabetes: Secondary | ICD-10-CM

## 2022-03-20 DIAGNOSIS — M79672 Pain in left foot: Secondary | ICD-10-CM

## 2022-03-20 DIAGNOSIS — M545 Low back pain, unspecified: Secondary | ICD-10-CM

## 2022-03-20 DIAGNOSIS — E559 Vitamin D deficiency, unspecified: Secondary | ICD-10-CM

## 2022-03-20 DIAGNOSIS — M2241 Chondromalacia patellae, right knee: Secondary | ICD-10-CM

## 2022-03-20 DIAGNOSIS — M2550 Pain in unspecified joint: Secondary | ICD-10-CM

## 2022-03-20 DIAGNOSIS — Z8269 Family history of other diseases of the musculoskeletal system and connective tissue: Secondary | ICD-10-CM

## 2022-03-20 DIAGNOSIS — M797 Fibromyalgia: Secondary | ICD-10-CM

## 2022-04-24 NOTE — Progress Notes (Signed)
Office Visit Note  Patient: [name]             Date of Birth: 1980-11-27           MRN: [id number]             PCP: Jarrett Soho, PA-C Referring: Jarrett Soho, PA-C Visit Date: 05/08/2022 Occupation: @GUAROCC @  Subjective:  Pain in multiple joints  History of Present Illness: Lakresha Sheetz is a 41 y.o. female with history of fibromyalgia.  She states she has been having increased pain and discomfort all over.  She has been experiencing increased pain in her bilateral hands.  She is also notices tingling in her right hand.  She continues to have off-and-on discomfort in her right knee and bilateral feet.  She also has lower back pain for which she has been followed by Dr. 55.  She had cortisone injection in her lumbar spine in June 2023.  He had some relief in the lower back pain with physical therapy.  She states the fibromyalgia symptoms are also flaring with the weather change.  She notices significant stiffness in the morning and after prolonged sitting.  She has difficulty climbing stairs and getting up from the chair.  She also has difficulty writing.  She has been taking Cymbalta 60 mg p.o. daily and methocarbamol 500 mg p.o. twice daily on a as needed basis.  Activities of Daily Living:  Patient reports morning stiffness for 15 minutes.   Patient Denies nocturnal pain.  Difficulty dressing/grooming: Denies Difficulty climbing stairs: Reports Difficulty getting out of chair: Reports Difficulty using hands for taps, buttons, cutlery, and/or writing: Reports  Review of Systems  Constitutional:  Positive for fatigue.  HENT:  Negative for mouth sores and mouth dryness.   Eyes:  Positive for dryness.  Respiratory:  Negative for shortness of breath.   Cardiovascular:  Negative for chest pain and palpitations.  Gastrointestinal:  Negative for blood in stool, constipation and diarrhea.  Endocrine: Negative for increased urination.  Genitourinary:  Negative for  involuntary urination.  Musculoskeletal:  Positive for joint pain, joint pain, myalgias, morning stiffness and myalgias.  Skin:  Negative for color change, rash and sensitivity to sunlight.  Allergic/Immunologic: Negative for susceptible to infections.  Neurological:  Negative for dizziness and headaches.  Hematological:  Negative for swollen glands.  Psychiatric/Behavioral:  Negative for depressed mood and sleep disturbance. The patient is not nervous/anxious.     PMFS History:  Patient Active Problem List   Diagnosis Date Noted   Protrusion of lumbar intervertebral disc 01/17/2022   Body mass index (BMI) 39.0-39.9, adult 05/23/2021   Fibromyalgia 05/03/2020   Chondromalacia of right patella 05/03/2020   Family history of systemic lupus erythematosus 09/16/2018   Hx of migraines 08/21/2018   Prediabetes 08/21/2018   Vitamin D deficiency 08/21/2018   History of carpal tunnel syndrome 08/21/2018    Past Medical History:  Diagnosis Date   Healthy adult    Migraine     Family History  Problem Relation Age of Onset   Hypertension Mother    Diabetes Mother    Asthma Mother    Asthma Father    Heart attack Father    Hypertension Brother    Heart attack Brother    Asthma Brother    Healthy Son    Healthy Daughter    Past Surgical History:  Procedure Laterality Date   ABLATION     CHOLECYSTECTOMY     TONSILLECTOMY     TUBAL LIGATION  Social History   Social History Narrative   Lives with two children   Drinks caffeine every once in a while    Immunization History  Administered Date(s) Administered   Hepatitis B, adult 12/11/2017, 01/11/2018, 06/17/2018   Influenza Split 04/20/2013   PPD Test 12/11/2017   Tdap 10/01/2007, 09/22/2017     Objective: Vital Signs: BP 132/85 (BP Location: Right Arm, Patient Position: Sitting, Cuff Size: Large)   Pulse (!) 103   Resp 15   Ht 5' 2.5" (1.588 m)   Wt 229 lb (103.9 kg)   BMI 41.22 kg/m    Physical Exam Vitals  and nursing note reviewed.  Constitutional:      Appearance: She is well-developed.  HENT:     Head: Normocephalic and atraumatic.  Eyes:     Conjunctiva/sclera: Conjunctivae normal.  Cardiovascular:     Rate and Rhythm: Normal rate and regular rhythm.     Heart sounds: Normal heart sounds.  Pulmonary:     Effort: Pulmonary effort is normal.     Breath sounds: Normal breath sounds.  Abdominal:     General: Bowel sounds are normal.     Palpations: Abdomen is soft.  Musculoskeletal:     Cervical back: Normal range of motion.  Lymphadenopathy:     Cervical: No cervical adenopathy.  Skin:    General: Skin is warm and dry.     Capillary Refill: Capillary refill takes less than 2 seconds.  Neurological:     Mental Status: She is alert and oriented to person, place, and time.  Psychiatric:        Behavior: Behavior normal.      Musculoskeletal Exam: Cervical spine was in good range of motion.  She had good range of motion of the lumbar spine with discomfort.  Shoulder joints, elbow joints, wrist joints, MCPs PIPs and DIPs were in good range of motion without any synovitis.  She had tenderness over bilateral CMC joints and some of the PIP joints.  Hip joints and knee joints were in good range of motion.  She had tenderness across her MTPs and ankles.  No synovitis was noted.  CDAI Exam: CDAI Score: -- Patient Global: --; Provider Global: -- Swollen: --; Tender: -- Joint Exam 05/08/2022   No joint exam has been documented for this visit   There is currently no information documented on the homunculus. Go to the Rheumatology activity and complete the homunculus joint exam.  Investigation: No additional findings.  Imaging: No results found.  Recent Labs: Lab Results  Component Value Date   WBC 8.2 09/13/2021   HGB 12.6 09/13/2021   PLT 243 09/13/2021   NA 141 09/13/2021   K 4.7 09/13/2021   CL 105 09/13/2021   CO2 29 09/13/2021   GLUCOSE 91 09/13/2021   BUN 8  09/13/2021   CREATININE 0.69 09/13/2021   BILITOT 0.2 09/13/2021   ALKPHOS 55 09/25/2014   AST 14 09/13/2021   ALT 15 09/13/2021   PROT 6.7 09/13/2021   ALBUMIN 3.5 09/25/2014   CALCIUM 9.4 09/13/2021   GFRAA 131 01/07/2020    Speciality Comments: No specialty comments available.  Procedures:  No procedures performed Allergies: Pineapple, Tinidazole, Amoxicillin-pot clavulanate, Other, and Biaxin [clarithromycin]   Assessment / Plan:     Visit Diagnoses: Fibromyalgia-history of generalized pain and discomfort.  She complains of pain and discomfort in all of her joints and muscles.  She is on methocarbamol 500 mg p.o. twice daily as needed and Cymbalta 60  mg p.o. daily which helps her symptoms.  She has intermittent flares with the weather change.  Pain in both hands-she can planes of increased pain and discomfort in her bilateral hands.  She states the left hand has been more painful.  No synovitis was noted.  She has positive ANA.  I will schedule ultrasound of bilateral hands to evaluate this further.  We will also obtain sed rate today.  History of carpal tunnel syndrome - Bilateral-mild median nerve entrapment at wrist revealed on NCV with EMG on 05/16/21.  She is not using carpal tunnel braces.  Advised her to get carpal tunnel braces at night.  Chondromalacia of right patella-she has intermittent pain.  Pain in both feet -she is discomfort in her bilateral feet.  No swelling or synovitis was noted.  X-rays of both feet obtained on 01/28/19 by Dr. Erlinda Hong.   DDD (degenerative disc disease), lumbar-she is on a lower back pain.  She is followed by Dr. Lorin Mercy.  She had cortisone injection in the past which gave her minimal relief.  She is also tried physical therapy.  Positive ANA (antinuclear antibody) - ANA 1:320 Nuclear, dense fine speckled, Anti-CCP 37: Repeat CCP and body was negative.  Smith negative and dsDNA negative on 01/07/20: There is no history of oral ulcers, nasal ulcers, malar  rash, photosensitivity or Raynaud's phenomenon.  Follow-up in ANA and ENA panel today.  Positive anti-CCP test -repeat CCP was negative on March 2023.  She has no clinical features of rheumatoid arthritis at this time.  Vitamin D deficiency-she has been taking vitamin D and vitamin D levels have been normal.  Prediabetes  Hx of migraines  Family history of systemic lupus erythematosus  Orders: Orders Placed This Encounter  Procedures   Anti-DNA antibody, double-stranded   ANA   C3 and C4   Sedimentation rate   CBC with Differential/Platelet   COMPLETE METABOLIC PANEL WITH GFR   Sjogrens syndrome-A extractable nuclear antibody   Sjogrens syndrome-B extractable nuclear antibody   RNP Antibody   Anti-Smith antibody   No orders of the defined types were placed in this encounter.    Follow-Up Instructions: Return in about 3 months (around 08/08/2022) for FMS.   Bo Merino, MD  Note - This record has been created using Editor, commissioning.  Chart creation errors have been sought, but may not always  have been located. Such creation errors do not reflect on  the standard of medical care.

## 2022-05-08 ENCOUNTER — Ambulatory Visit: Payer: BC Managed Care – PPO | Attending: Rheumatology | Admitting: Rheumatology

## 2022-05-08 ENCOUNTER — Encounter: Payer: Self-pay | Admitting: Rheumatology

## 2022-05-08 VITALS — BP 132/85 | HR 103 | Resp 15 | Ht 62.5 in | Wt 229.0 lb

## 2022-05-08 DIAGNOSIS — M79642 Pain in left hand: Secondary | ICD-10-CM

## 2022-05-08 DIAGNOSIS — M79641 Pain in right hand: Secondary | ICD-10-CM | POA: Diagnosis not present

## 2022-05-08 DIAGNOSIS — M5136 Other intervertebral disc degeneration, lumbar region: Secondary | ICD-10-CM

## 2022-05-08 DIAGNOSIS — Z8669 Personal history of other diseases of the nervous system and sense organs: Secondary | ICD-10-CM | POA: Diagnosis not present

## 2022-05-08 DIAGNOSIS — M545 Low back pain, unspecified: Secondary | ICD-10-CM

## 2022-05-08 DIAGNOSIS — M2241 Chondromalacia patellae, right knee: Secondary | ICD-10-CM

## 2022-05-08 DIAGNOSIS — R768 Other specified abnormal immunological findings in serum: Secondary | ICD-10-CM

## 2022-05-08 DIAGNOSIS — M797 Fibromyalgia: Secondary | ICD-10-CM

## 2022-05-08 DIAGNOSIS — R7303 Prediabetes: Secondary | ICD-10-CM

## 2022-05-08 DIAGNOSIS — M79671 Pain in right foot: Secondary | ICD-10-CM

## 2022-05-08 DIAGNOSIS — Z8269 Family history of other diseases of the musculoskeletal system and connective tissue: Secondary | ICD-10-CM

## 2022-05-08 DIAGNOSIS — E559 Vitamin D deficiency, unspecified: Secondary | ICD-10-CM

## 2022-05-08 DIAGNOSIS — M2550 Pain in unspecified joint: Secondary | ICD-10-CM

## 2022-05-08 DIAGNOSIS — M79672 Pain in left foot: Secondary | ICD-10-CM

## 2022-05-11 LAB — RNP ANTIBODY: Ribonucleic Protein(ENA) Antibody, IgG: <1 AI

## 2022-05-11 LAB — SEDIMENTATION RATE: Sed Rate: 51 mm/h — ABNORMAL HIGH (ref 0–20)

## 2022-05-11 LAB — COMPLETE METABOLIC PANEL WITH GFR
AG Ratio: 1.4 (calc) (ref 1.0–2.5)
ALT: 16 U/L (ref 6–29)
AST: 16 U/L (ref 10–30)
Albumin: 4 g/dL (ref 3.6–5.1)
Alkaline phosphatase (APISO): 72 U/L (ref 31–125)
BUN: 9 mg/dL (ref 7–25)
CO2: 24 mmol/L (ref 20–32)
Calcium: 9.3 mg/dL (ref 8.6–10.2)
Chloride: 107 mmol/L (ref 98–110)
Creat: 0.86 mg/dL (ref 0.50–0.99)
Globulin: 2.8 g/dL (calc) (ref 1.9–3.7)
Glucose, Bld: 131 mg/dL — ABNORMAL HIGH (ref 65–99)
Potassium: 4.1 mmol/L (ref 3.5–5.3)
Sodium: 142 mmol/L (ref 135–146)
Total Bilirubin: 0.2 mg/dL (ref 0.2–1.2)
Total Protein: 6.8 g/dL (ref 6.1–8.1)
eGFR: 86 mL/min/{1.73_m2} (ref >=60)

## 2022-05-11 LAB — CBC WITH DIFFERENTIAL/PLATELET
Absolute Monocytes: 317 cells/uL (ref 200–950)
Basophils Absolute: 70 cells/uL (ref 0–200)
Basophils Relative: 0.8 %
Eosinophils Absolute: 246 cells/uL (ref 15–500)
Eosinophils Relative: 2.8 %
HCT: 39.2 % (ref 35.0–45.0)
Hemoglobin: 12.4 g/dL (ref 11.7–15.5)
Lymphs Abs: 3362 cells/uL (ref 850–3900)
MCH: 25.5 pg — ABNORMAL LOW (ref 27.0–33.0)
MCHC: 31.6 g/dL — ABNORMAL LOW (ref 32.0–36.0)
MCV: 80.7 fL (ref 80.0–100.0)
MPV: 12 fL (ref 7.5–12.5)
Monocytes Relative: 3.6 %
Neutro Abs: 4805 cells/uL (ref 1500–7800)
Neutrophils Relative %: 54.6 %
Platelets: 248 10*3/uL (ref 140–400)
RBC: 4.86 10*6/uL (ref 3.80–5.10)
RDW: 13.9 % (ref 11.0–15.0)
Total Lymphocyte: 38.2 %
WBC: 8.8 10*3/uL (ref 3.8–10.8)

## 2022-05-11 LAB — C3 AND C4
C3 Complement: 182 mg/dL (ref 83–193)
C4 Complement: 44 mg/dL (ref 15–57)

## 2022-05-11 LAB — SJOGRENS SYNDROME-A EXTRACTABLE NUCLEAR ANTIBODY: SSA (Ro) (ENA) Antibody, IgG: <1 AI

## 2022-05-11 LAB — ANTI-DNA ANTIBODY, DOUBLE-STRANDED: ds DNA Ab: <1 IU/mL

## 2022-05-11 LAB — ANA: Anti Nuclear Antibody (ANA): POSITIVE — AB

## 2022-05-11 LAB — ANTI-SMITH ANTIBODY: ENA SM Ab Ser-aCnc: <1 AI

## 2022-05-11 LAB — ANTI-NUCLEAR AB-TITER (ANA TITER): ANA Titer 1: 1:80 {titer} — ABNORMAL HIGH

## 2022-05-11 LAB — SJOGRENS SYNDROME-B EXTRACTABLE NUCLEAR ANTIBODY: SSB (La) (ENA) Antibody, IgG: <1 AI

## 2022-05-11 NOTE — Progress Notes (Signed)
ANA low titer positive, all other autoimmune antibodies are negative, complements are normal,CBC is normal, Glucose is mildly elevated, probably not a fasting sample.sed rate is elevated. I would recommend ultrasound of the hands if she is still having pain in her hands.

## 2022-07-05 ENCOUNTER — Ambulatory Visit: Payer: BC Managed Care – PPO | Attending: Rheumatology | Admitting: Rheumatology

## 2022-07-05 ENCOUNTER — Ambulatory Visit: Payer: BC Managed Care – PPO

## 2022-07-05 DIAGNOSIS — M79642 Pain in left hand: Secondary | ICD-10-CM

## 2022-07-05 DIAGNOSIS — M79641 Pain in right hand: Secondary | ICD-10-CM

## 2022-07-05 DIAGNOSIS — Z8669 Personal history of other diseases of the nervous system and sense organs: Secondary | ICD-10-CM

## 2022-07-05 DIAGNOSIS — R768 Other specified abnormal immunological findings in serum: Secondary | ICD-10-CM

## 2022-07-05 NOTE — Progress Notes (Signed)
Visit diagnosis: Pain in both hands, positive ANA  Ultrasound examination of bilateral hands was performed per EULAR recommendations. Using 15 MHz transducer, grayscale and power Doppler bilateral second, and third MCP joints  both dorsal and volar aspects were evaluated to look for synovitis or tenosynovitis. The findings were there was no synovitis or tenosynovitis on ultrasound examination.  Impression: Ultrasound examination not show synovitis or tenosynovitis.  Ultrasound findings were discussed with the patient.  Pollyann Savoy, MD

## 2022-07-29 NOTE — Progress Notes (Deleted)
Office Visit Note  Patient: [name]             Date of Birth: 1980-12-06           MRN: [id number]             PCP: Marda Stalker, PA-C Referring: Marda Stalker, PA-C Visit Date: 08/12/2022 Occupation: '@GUAROCC'$ @  Subjective:    History of Present Illness: [name] is a 42 y.o. female with history of fibromyalgia and DDD.  She remains on cymbalta 60 mg daily and takes robaxin 500 mg BID PRN for muscle spasms.     Activities of Daily Living:  Patient reports morning stiffness for *** {minute/hour:19697}.   Patient {ACTIONS;DENIES/REPORTS:21021675::"Denies"} nocturnal pain.  Difficulty dressing/grooming: {ACTIONS;DENIES/REPORTS:21021675::"Denies"} Difficulty climbing stairs: {ACTIONS;DENIES/REPORTS:21021675::"Denies"} Difficulty getting out of chair: {ACTIONS;DENIES/REPORTS:21021675::"Denies"} Difficulty using hands for taps, buttons, cutlery, and/or writing: {ACTIONS;DENIES/REPORTS:21021675::"Denies"}  No Rheumatology ROS completed.   PMFS History:  Patient Active Problem List   Diagnosis Date Noted   Protrusion of lumbar intervertebral disc 01/17/2022   Body mass index (BMI) 39.0-39.9, adult 05/23/2021   Fibromyalgia 05/03/2020   Chondromalacia of right patella 05/03/2020   Family history of systemic lupus erythematosus 09/16/2018   Hx of migraines 08/21/2018   Prediabetes 08/21/2018   Vitamin D deficiency 08/21/2018   History of carpal tunnel syndrome 08/21/2018    Past Medical History:  Diagnosis Date   Healthy adult    Migraine     Family History  Problem Relation Age of Onset   Hypertension Mother    Diabetes Mother    Asthma Mother    Asthma Father    Heart attack Father    Hypertension Brother    Heart attack Brother    Asthma Brother    Healthy Son    Healthy Daughter    Past Surgical History:  Procedure Laterality Date   ABLATION     CHOLECYSTECTOMY     TONSILLECTOMY     TUBAL LIGATION     Social History   Social  History Narrative   Lives with two children   Drinks caffeine every once in a while    Immunization History  Administered Date(s) Administered   Hepatitis B, adult 12/11/2017, 01/11/2018, 06/17/2018   Influenza Split 04/20/2013   PPD Test 12/11/2017   Tdap 10/01/2007, 09/22/2017     Objective: Vital Signs: There were no vitals taken for this visit.   Physical Exam Vitals and nursing note reviewed.  Constitutional:      Appearance: She is well-developed.  HENT:     Head: Normocephalic and atraumatic.  Eyes:     Conjunctiva/sclera: Conjunctivae normal.  Cardiovascular:     Rate and Rhythm: Normal rate and regular rhythm.     Heart sounds: Normal heart sounds.  Pulmonary:     Effort: Pulmonary effort is normal.     Breath sounds: Normal breath sounds.  Abdominal:     General: Bowel sounds are normal.     Palpations: Abdomen is soft.  Musculoskeletal:     Cervical back: Normal range of motion.  Skin:    General: Skin is warm and dry.     Capillary Refill: Capillary refill takes less than 2 seconds.  Neurological:     Mental Status: She is alert and oriented to person, place, and time.  Psychiatric:        Behavior: Behavior normal.      Musculoskeletal Exam: ***  CDAI Exam: CDAI Score: -- Patient Global: --; Provider Global: -- Swollen: --; Tender: --  Joint Exam 08/12/2022   No joint exam has been documented for this visit   There is currently no information documented on the homunculus. Go to the Rheumatology activity and complete the homunculus joint exam.  Investigation: No additional findings.  Imaging: [place] LIMITED JOINT SPACE STRUCTURES UP BILAT  Result Date: 07/05/2022 Ultrasound examination of bilateral hands was performed per EULAR recommendations. Using 15 MHz transducer, grayscale and power Doppler bilateral second, and third MCP joints  both dorsal and volar aspects were evaluated to look for synovitis or tenosynovitis. The findings were there was  no synovitis or tenosynovitis on ultrasound examination. Impression: Ultrasound examination not show synovitis or tenosynovitis.   Recent Labs: Lab Results  Component Value Date   WBC 8.8 05/08/2022   HGB 12.4 05/08/2022   PLT 248 05/08/2022   NA 142 05/08/2022   K 4.1 05/08/2022   CL 107 05/08/2022   CO2 24 05/08/2022   GLUCOSE 131 (H) 05/08/2022   BUN 9 05/08/2022   CREATININE 0.86 05/08/2022   BILITOT 0.2 05/08/2022   ALKPHOS 55 09/25/2014   AST 16 05/08/2022   ALT 16 05/08/2022   PROT 6.8 05/08/2022   ALBUMIN 3.5 09/25/2014   CALCIUM 9.3 05/08/2022   GFRAA 131 01/07/2020    Speciality Comments: No specialty comments available.  Procedures:  No procedures performed Allergies: Pineapple, Tinidazole, Amoxicillin-pot clavulanate, Other, and Biaxin [clarithromycin]   Assessment / Plan:     Visit Diagnoses: No diagnosis found.  Orders: No orders of the defined types were placed in this encounter.  No orders of the defined types were placed in this encounter.   Face-to-face time spent with patient was *** minutes. Greater than 50% of time was spent in counseling and coordination of care.  Follow-Up Instructions: No follow-ups on file.   Earnestine Mealing, CMA  Note - This record has been created using Editor, commissioning.  Chart creation errors have been sought, but may not always  have been located. Such creation errors do not reflect on  the standard of medical care.

## 2022-08-06 ENCOUNTER — Ambulatory Visit (HOSPITAL_COMMUNITY)
Admission: EM | Admit: 2022-08-06 | Discharge: 2022-08-06 | Disposition: A | Discharge status: Home / Self Care | Payer: BC Managed Care – PPO | Attending: Family Medicine | Admitting: Family Medicine

## 2022-08-06 ENCOUNTER — Encounter (HOSPITAL_COMMUNITY): Payer: Self-pay

## 2022-08-06 DIAGNOSIS — K529 Noninfective gastroenteritis and colitis, unspecified: Secondary | ICD-10-CM

## 2022-08-06 HISTORY — DX: Fibromyalgia: M79.7

## 2022-08-06 MED ORDER — DIPHENOXYLATE-ATROPINE 2.5-0.025 MG PO TABS: 1.0000 | ORAL_TABLET | Freq: Four times a day (QID) | ORAL | 0 refills | Status: AC | PRN

## 2022-08-06 NOTE — ED Triage Notes (Signed)
Patient c/o mid abdominal pain and diarrhea x 2 days.  Patient reports that she has taken Pepto Bismol and Imodium at 1345 today.

## 2022-08-06 NOTE — ED Provider Notes (Signed)
[facility]    CSN: [id number] Arrival date & time: 08/06/22  1846      History   Chief Complaint Chief Complaint  Patient presents with   Abdominal Pain   Diarrhea    HPI [name] is a 42 y.o. female.    Abdominal Pain Associated symptoms: diarrhea   Diarrhea Associated symptoms: abdominal pain    Here for diarrhea and abdominal pain.  Symptoms have been going on since January 21.  On January 21 and 22, she had numerous loose watery stools.  Probably 15 or more each day.  Today she has had about 6.  She has tried some Imodium and Pepto, but is uncertain if its helped.  The pain is epigastric and toward the right. She has not had any fever and she has not had any nausea or vomiting.  She is actually hungry.  No blood in the stool.  No known exposures.  No cough or congestion.  Past Medical History:  Diagnosis Date   Fibromyalgia    Healthy adult    Migraine     Patient Active Problem List   Diagnosis Date Noted   Protrusion of lumbar intervertebral disc 01/17/2022   Body mass index (BMI) 39.0-39.9, adult 05/23/2021   Fibromyalgia 05/03/2020   Chondromalacia of right patella 05/03/2020   Family history of systemic lupus erythematosus 09/16/2018   Hx of migraines 08/21/2018   Prediabetes 08/21/2018   Vitamin D deficiency 08/21/2018   History of carpal tunnel syndrome 08/21/2018    Past Surgical History:  Procedure Laterality Date   ABLATION     CHOLECYSTECTOMY     TONSILLECTOMY     TUBAL LIGATION      OB History   No obstetric history on file.      Home Medications    Prior to Admission medications   Medication Sig Start Date End Date Taking? Authorizing Provider  diphenoxylate-atropine (LOMOTIL) 2.5-0.025 MG tablet Take 1-2 tablets by mouth 4 (four) times daily as needed for diarrhea or loose stools. 08/06/22  Yes Barrett Henle, MD  aspirin-acetaminophen-caffeine (EXCEDRIN MIGRAINE) [phone] MG per tablet Take by mouth  every 6 (six) hours as needed for headache.    [provider]  cetirizine (ZYRTEC) 10 MG tablet Take 10 mg by mouth daily as needed for allergies.    [provider]  Cholecalciferol (VITAMIN D3) 5000 UNITS TABS Take 5,000 Units by mouth daily.    [provider]  diclofenac Sodium (VOLTAREN) 1 % GEL Apply 2-4 grams to affected joint 4 times daily as needed. 04/19/21   Bo Merino, MD  DULoxetine (CYMBALTA) 60 MG capsule TAKE 1 CAPSULE BY MOUTH EVERY DAY 03/15/22   Ofilia Neas, PA-C  meclizine (ANTIVERT) 25 MG tablet Take 25 mg by mouth 2 (two) times daily as needed. 04/30/21   [provider]  methocarbamol (ROBAXIN) 500 MG tablet Take 1 tablet (500 mg total) by mouth 2 (two) times daily as needed for muscle spasms. 07/18/21   Ofilia Neas, PA-C  Polyvinyl Alcohol-Povidone (REFRESH OP) Apply to eye as needed.     [provider]  valACYclovir (VALTREX) 500 MG tablet Take 500 mg by mouth daily. 11/19/19   [provider]    Family History Family History  Problem Relation Age of Onset   Hypertension Mother    Diabetes Mother    Asthma Mother    Asthma Father    Heart attack Father    Urolithiasis Father  Hypertension Brother    Heart attack Brother    Diverticulitis Brother    Urolithiasis Brother    Asthma Brother    Healthy Daughter    Healthy Son     Social History Social History   Tobacco Use   Smoking status: Never    Passive exposure: Never   Smokeless tobacco: Never  Vaping Use   Vaping Use: Never used  Substance Use Topics   Alcohol use: Yes    Comment: occ   Drug use: Never     Allergies   Pineapple, Tinidazole, Amoxicillin-pot clavulanate, Other, and Biaxin [clarithromycin]   Review of Systems Review of Systems  Gastrointestinal:  Positive for abdominal pain and diarrhea.     Physical Exam Triage Vital Signs ED Triage Vitals  Enc Vitals Group     BP 08/06/22 1944 126/86     Pulse Rate  08/06/22 1944 (!) 109     Resp 08/06/22 1944 16     Temp 08/06/22 1944 99.1 F (37.3 C)     Temp Source 08/06/22 1944 Oral     SpO2 08/06/22 1944 95 %     Weight --      Height --      Head Circumference --      Peak Flow --      Pain Score 08/06/22 1947 5     Pain Loc --      Pain Edu? --      Excl. in [place]? --    No data found.  Updated Vital Signs BP 126/86 (BP Location: Left Arm)   Pulse (!) 109   Temp 99.1 F (37.3 C) (Oral)   Resp 16   SpO2 95%   Visual Acuity Right Eye Distance:   Left Eye Distance:   Bilateral Distance:    Right Eye Near:   Left Eye Near:    Bilateral Near:     Physical Exam Vitals reviewed.  Constitutional:      General: She is not in acute distress.    Appearance: She is not toxic-appearing.  HENT:     Right Ear: Ear canal normal.     Nose: Nose normal.     Mouth/Throat:     Mouth: Mucous membranes are moist.     Pharynx: No oropharyngeal exudate or posterior oropharyngeal erythema.  Eyes:     Extraocular Movements: Extraocular movements intact.     Conjunctiva/sclera: Conjunctivae normal.     Pupils: Pupils are equal, round, and reactive to light.  Cardiovascular:     Rate and Rhythm: Normal rate and regular rhythm.     Heart sounds: No murmur heard. Pulmonary:     Effort: No respiratory distress.     Breath sounds: No stridor. No wheezing, rhonchi or rales.  Abdominal:     General: There is no distension.     Palpations: Abdomen is soft. There is no mass.     Tenderness: There is abdominal tenderness (In the epigastrium).  Musculoskeletal:     Cervical back: Neck supple.  Lymphadenopathy:     Cervical: No cervical adenopathy.  Skin:    Capillary Refill: Capillary refill takes less than 2 seconds.     Coloration: Skin is not jaundiced or pale.  Neurological:     General: No focal deficit present.     Mental Status: She is alert and oriented to person, place, and time.  Psychiatric:        Behavior: Behavior normal.  UC Treatments / Results  Labs (all labs ordered are listed, but only abnormal results are displayed) Labs Reviewed - No data to display  EKG   Radiology No results found.  Procedures Procedures (including critical care time)  Medications Ordered in UC Medications - No data to display  Initial Impression / Assessment and Plan / UC Course  I have reviewed the triage vital signs and the nursing notes.  Pertinent labs & imaging results that were available during my care of the patient were reviewed by me and considered in my medical decision making (see chart for details).        Lomotil is sent for her diarrhea.  We discussed clear liquids and bland diet, and if she worsens or does not improve with the medication, she should present to the emergency room for further evaluation Final Clinical Impressions(s) / UC Diagnoses   Final diagnoses:  Gastroenteritis     Discharge Instructions      Take Lomotil 1 to 2 tablets by mouth 4 times daily as needed for diarrhea.  Get in plenty of fluids, including clear liquids and bland foods   If you worsen or if you are not improving, then please present to the emergency room for further evaluation and treatment     ED Prescriptions     Medication Sig Dispense Auth. Provider   diphenoxylate-atropine (LOMOTIL) 2.5-0.025 MG tablet Take 1-2 tablets by mouth 4 (four) times daily as needed for diarrhea or loose stools. 10 tablet Marlinda Mike Janace Aris, MD      I have reviewed the PDMP during this encounter.   Zenia Resides, MD 08/06/22 2007

## 2022-08-06 NOTE — Discharge Instructions (Signed)
Take Lomotil 1 to 2 tablets by mouth 4 times daily as needed for diarrhea.  Get in plenty of fluids, including clear liquids and bland foods   If you worsen or if you are not improving, then please present to the emergency room for further evaluation and treatment

## 2022-08-12 ENCOUNTER — Ambulatory Visit: Payer: BC Managed Care – PPO | Admitting: Physician Assistant

## 2022-08-12 DIAGNOSIS — M5136 Other intervertebral disc degeneration, lumbar region: Secondary | ICD-10-CM

## 2022-08-12 DIAGNOSIS — M2241 Chondromalacia patellae, right knee: Secondary | ICD-10-CM

## 2022-08-12 DIAGNOSIS — R768 Other specified abnormal immunological findings in serum: Secondary | ICD-10-CM

## 2022-08-12 DIAGNOSIS — M79641 Pain in right hand: Secondary | ICD-10-CM

## 2022-08-12 DIAGNOSIS — E559 Vitamin D deficiency, unspecified: Secondary | ICD-10-CM

## 2022-08-12 DIAGNOSIS — Z8669 Personal history of other diseases of the nervous system and sense organs: Secondary | ICD-10-CM

## 2022-08-12 DIAGNOSIS — M797 Fibromyalgia: Secondary | ICD-10-CM

## 2022-08-12 DIAGNOSIS — R7303 Prediabetes: Secondary | ICD-10-CM

## 2022-08-12 DIAGNOSIS — Z8269 Family history of other diseases of the musculoskeletal system and connective tissue: Secondary | ICD-10-CM

## 2022-08-12 DIAGNOSIS — M79672 Pain in left foot: Secondary | ICD-10-CM

## 2022-09-12 ENCOUNTER — Telehealth: Payer: Self-pay | Admitting: Rheumatology

## 2022-09-12 ENCOUNTER — Other Ambulatory Visit: Payer: Self-pay | Admitting: Physician Assistant

## 2022-09-12 NOTE — Telephone Encounter (Signed)
I called patient, handicapped placard at front desk for patient to pick up.

## 2022-09-12 NOTE — Telephone Encounter (Signed)
Patient called the office stating her handicap placard expires today and would like it renewed.

## 2022-09-16 NOTE — Progress Notes (Deleted)
Office Visit Note  Patient: [name]             Date of Birth: 04-05-1981           MRN: [id number]             PCP: Marda Stalker, PA-C Referring: Marda Stalker, PA-C Visit Date: 09/17/2022 Occupation: '@GUAROCC'$ @  Subjective:  No chief complaint on file.   History of Present Illness: [name] is a 42 y.o. female ***     Activities of Daily Living:  Patient reports morning stiffness for *** {minute/hour:19697}.   Patient {ACTIONS;DENIES/REPORTS:21021675::"Denies"} nocturnal pain.  Difficulty dressing/grooming: {ACTIONS;DENIES/REPORTS:21021675::"Denies"} Difficulty climbing stairs: {ACTIONS;DENIES/REPORTS:21021675::"Denies"} Difficulty getting out of chair: {ACTIONS;DENIES/REPORTS:21021675::"Denies"} Difficulty using hands for taps, buttons, cutlery, and/or writing: {ACTIONS;DENIES/REPORTS:21021675::"Denies"}  No Rheumatology ROS completed.   PMFS History:  Patient Active Problem List   Diagnosis Date Noted   Protrusion of lumbar intervertebral disc 01/17/2022   Body mass index (BMI) 39.0-39.9, adult 05/23/2021   Fibromyalgia 05/03/2020   Chondromalacia of right patella 05/03/2020   Family history of systemic lupus erythematosus 09/16/2018   Hx of migraines 08/21/2018   Prediabetes 08/21/2018   Vitamin D deficiency 08/21/2018   History of carpal tunnel syndrome 08/21/2018    Past Medical History:  Diagnosis Date   Fibromyalgia    Healthy adult    Migraine     Family History  Problem Relation Age of Onset   Hypertension Mother    Diabetes Mother    Asthma Mother    Asthma Father    Heart attack Father    Urolithiasis Father    Hypertension Brother    Heart attack Brother    Diverticulitis Brother    Urolithiasis Brother    Asthma Brother    Healthy Daughter    Healthy Son    Past Surgical History:  Procedure Laterality Date   ABLATION     CHOLECYSTECTOMY     TONSILLECTOMY     TUBAL LIGATION     Social History   Social History  Narrative   Lives with two children   Drinks caffeine every once in a while    Immunization History  Administered Date(s) Administered   Hepatitis B, ADULT 12/11/2017, 01/11/2018, 06/17/2018   Influenza Split 04/20/2013   PPD Test 12/11/2017   Tdap 10/01/2007, 09/22/2017     Objective: Vital Signs: There were no vitals taken for this visit.   Physical Exam   Musculoskeletal Exam: ***  CDAI Exam: CDAI Score: -- Patient Global: --; Provider Global: -- Swollen: --; Tender: -- Joint Exam 09/17/2022   No joint exam has been documented for this visit   There is currently no information documented on the homunculus. Go to the Rheumatology activity and complete the homunculus joint exam.  Investigation: No additional findings.  Imaging: No results found.  Recent Labs: Lab Results  Component Value Date   WBC 8.8 05/08/2022   HGB 12.4 05/08/2022   PLT 248 05/08/2022   NA 142 05/08/2022   K 4.1 05/08/2022   CL 107 05/08/2022   CO2 24 05/08/2022   GLUCOSE 131 (H) 05/08/2022   BUN 9 05/08/2022   CREATININE 0.86 05/08/2022   BILITOT 0.2 05/08/2022   ALKPHOS 55 09/25/2014   AST 16 05/08/2022   ALT 16 05/08/2022   PROT 6.8 05/08/2022   ALBUMIN 3.5 09/25/2014   CALCIUM 9.3 05/08/2022   GFRAA 131 01/07/2020    Speciality Comments: No specialty comments available.  Procedures:  No procedures performed Allergies: Pineapple, Tinidazole,  Amoxicillin-pot clavulanate, Other, and Biaxin [clarithromycin]   Assessment / Plan:     Visit Diagnoses: Fibromyalgia  Pain in both hands  History of carpal tunnel syndrome - Bilateral-mild median nerve entrapment at wrist revealed on NCV with EMG on 05/16/21.  Chondromalacia of right patella  Pain in both feet  DDD (degenerative disc disease), lumbar  Vitamin D deficiency  Positive ANA (antinuclear antibody) - - ANA 1:320 Nuclear, dense fine speckled, Anti-CCP 37: Repeat CCP and body was negative.  Smith negative and dsDNA  negative on 01/07/20:  Positive anti-CCP test - repeat CCP was negative on March 2023.  She has no clinical features of rheumatoid arthritis at this time  Prediabetes  Hx of migraines  Family history of systemic lupus erythematosus  Orders: No orders of the defined types were placed in this encounter.  No orders of the defined types were placed in this encounter.   Face-to-face time spent with patient was *** minutes. Greater than 50% of time was spent in counseling and coordination of care.  Follow-Up Instructions: No follow-ups on file.   Ofilia Neas, PA-C  Note - This record has been created using Dragon software.  Chart creation errors have been sought, but may not always  have been located. Such creation errors do not reflect on  the standard of medical care.

## 2022-09-17 ENCOUNTER — Ambulatory Visit: Payer: BC Managed Care – PPO | Admitting: Physician Assistant

## 2022-09-17 DIAGNOSIS — Z8669 Personal history of other diseases of the nervous system and sense organs: Secondary | ICD-10-CM

## 2022-09-17 DIAGNOSIS — M5136 Other intervertebral disc degeneration, lumbar region: Secondary | ICD-10-CM

## 2022-09-17 DIAGNOSIS — M79641 Pain in right hand: Secondary | ICD-10-CM

## 2022-09-17 DIAGNOSIS — R768 Other specified abnormal immunological findings in serum: Secondary | ICD-10-CM

## 2022-09-17 DIAGNOSIS — R7303 Prediabetes: Secondary | ICD-10-CM

## 2022-09-17 DIAGNOSIS — E559 Vitamin D deficiency, unspecified: Secondary | ICD-10-CM

## 2022-09-17 DIAGNOSIS — M2241 Chondromalacia patellae, right knee: Secondary | ICD-10-CM

## 2022-09-17 DIAGNOSIS — Z8269 Family history of other diseases of the musculoskeletal system and connective tissue: Secondary | ICD-10-CM

## 2022-09-17 DIAGNOSIS — M797 Fibromyalgia: Secondary | ICD-10-CM

## 2022-09-17 DIAGNOSIS — M79671 Pain in right foot: Secondary | ICD-10-CM

## 2022-09-17 NOTE — Progress Notes (Unsigned)
Office Visit Note  Patient: [name]             Date of Birth: [birth year]           MRN: [id number]             PCP: Marda Stalker, PA-C Referring: Marda Stalker, PA-C Visit Date: 09/18/2022 Occupation: '@GUAROCC'$ @  Subjective:  No chief complaint on file.   History of Present Illness: [name] is a 42 y.o. female ***    U/s of both hands on 07/05/22 negative for synovitis   Activities of Daily Living:  Patient reports morning stiffness for *** {minute/hour:19697}.   Patient {ACTIONS;DENIES/REPORTS:21021675::"Denies"} nocturnal pain.  Difficulty dressing/grooming: {ACTIONS;DENIES/REPORTS:21021675::"Denies"} Difficulty climbing stairs: {ACTIONS;DENIES/REPORTS:21021675::"Denies"} Difficulty getting out of chair: {ACTIONS;DENIES/REPORTS:21021675::"Denies"} Difficulty using hands for taps, buttons, cutlery, and/or writing: {ACTIONS;DENIES/REPORTS:21021675::"Denies"}  No Rheumatology ROS completed.   PMFS History:  Patient Active Problem List   Diagnosis Date Noted   Protrusion of lumbar intervertebral disc 01/17/2022   Body mass index (BMI) 39.0-39.9, adult 05/23/2021   Fibromyalgia 05/03/2020   Chondromalacia of right patella 05/03/2020   Family history of systemic lupus erythematosus 09/16/2018   Hx of migraines 08/21/2018   Prediabetes 08/21/2018   Vitamin D deficiency 08/21/2018   History of carpal tunnel syndrome 08/21/2018    Past Medical History:  Diagnosis Date   Fibromyalgia    Healthy adult    Migraine     Family History  Problem Relation Age of Onset   Hypertension Mother    Diabetes Mother    Asthma Mother    Asthma Father    Heart attack Father    Urolithiasis Father    Hypertension Brother    Heart attack Brother    Diverticulitis Brother    Urolithiasis Brother    Asthma Brother    Healthy Daughter    Healthy Son    Past Surgical History:  Procedure Laterality Date   ABLATION     CHOLECYSTECTOMY     TONSILLECTOMY      TUBAL LIGATION     Social History   Social History Narrative   Lives with two children   Drinks caffeine every once in a while    Immunization History  Administered Date(s) Administered   Hepatitis B, ADULT 12/11/2017, 01/11/2018, 06/17/2018   Influenza Split 04/20/2013   PPD Test 12/11/2017   Tdap 10/01/2007, 09/22/2017     Objective: Vital Signs: There were no vitals taken for this visit.   Physical Exam   Musculoskeletal Exam: ***  CDAI Exam: CDAI Score: -- Patient Global: --; Provider Global: -- Swollen: --; Tender: -- Joint Exam 09/18/2022   No joint exam has been documented for this visit   There is currently no information documented on the homunculus. Go to the Rheumatology activity and complete the homunculus joint exam.  Investigation: No additional findings.  Imaging: No results found.  Recent Labs: Lab Results  Component Value Date   WBC 8.8 05/08/2022   HGB 12.4 05/08/2022   PLT 248 05/08/2022   NA 142 05/08/2022   K 4.1 05/08/2022   CL 107 05/08/2022   CO2 24 05/08/2022   GLUCOSE 131 (H) 05/08/2022   BUN 9 05/08/2022   CREATININE 0.86 05/08/2022   BILITOT 0.2 05/08/2022   ALKPHOS 55 09/25/2014   AST 16 05/08/2022   ALT 16 05/08/2022   PROT 6.8 05/08/2022   ALBUMIN 3.5 09/25/2014   CALCIUM 9.3 05/08/2022   GFRAA 131 01/07/2020    Speciality Comments: No specialty comments  available.  Procedures:  No procedures performed Allergies: Pineapple, Tinidazole, Amoxicillin-pot clavulanate, Other, and Biaxin [clarithromycin]   Assessment / Plan:     Visit Diagnoses: Fibromyalgia  Positive ANA (antinuclear antibody)  History of carpal tunnel syndrome  Chondromalacia of right patella  DDD (degenerative disc disease), lumbar  Vitamin D deficiency  Prediabetes  Hx of migraines  Family history of systemic lupus erythematosus  Orders: No orders of the defined types were placed in this encounter.  No orders of the defined types  were placed in this encounter.   Face-to-face time spent with patient was *** minutes. Greater than 50% of time was spent in counseling and coordination of care.  Follow-Up Instructions: No follow-ups on file.   Ofilia Neas, PA-C  Note - This record has been created using Dragon software.  Chart creation errors have been sought, but may not always  have been located. Such creation errors do not reflect on  the standard of medical care.

## 2022-09-18 ENCOUNTER — Ambulatory Visit: Payer: BC Managed Care – PPO | Attending: Physician Assistant | Admitting: Physician Assistant

## 2022-09-18 ENCOUNTER — Encounter: Payer: Self-pay | Admitting: Physician Assistant

## 2022-09-18 ENCOUNTER — Ambulatory Visit (INDEPENDENT_AMBULATORY_CARE_PROVIDER_SITE_OTHER): Payer: BC Managed Care – PPO

## 2022-09-18 VITALS — BP 131/78 | HR 93 | Resp 16 | Ht 62.5 in | Wt 229.2 lb

## 2022-09-18 DIAGNOSIS — M51369 Other intervertebral disc degeneration, lumbar region without mention of lumbar back pain or lower extremity pain: Secondary | ICD-10-CM

## 2022-09-18 DIAGNOSIS — M2241 Chondromalacia patellae, right knee: Secondary | ICD-10-CM | POA: Diagnosis not present

## 2022-09-18 DIAGNOSIS — R768 Other specified abnormal immunological findings in serum: Secondary | ICD-10-CM | POA: Diagnosis not present

## 2022-09-18 DIAGNOSIS — G8929 Other chronic pain: Secondary | ICD-10-CM

## 2022-09-18 DIAGNOSIS — Z8669 Personal history of other diseases of the nervous system and sense organs: Secondary | ICD-10-CM | POA: Diagnosis not present

## 2022-09-18 DIAGNOSIS — E559 Vitamin D deficiency, unspecified: Secondary | ICD-10-CM

## 2022-09-18 DIAGNOSIS — M79672 Pain in left foot: Secondary | ICD-10-CM

## 2022-09-18 DIAGNOSIS — R197 Diarrhea, unspecified: Secondary | ICD-10-CM

## 2022-09-18 DIAGNOSIS — R7303 Prediabetes: Secondary | ICD-10-CM

## 2022-09-18 DIAGNOSIS — R21 Rash and other nonspecific skin eruption: Secondary | ICD-10-CM

## 2022-09-18 DIAGNOSIS — M25561 Pain in right knee: Secondary | ICD-10-CM | POA: Diagnosis not present

## 2022-09-18 DIAGNOSIS — M5136 Other intervertebral disc degeneration, lumbar region: Secondary | ICD-10-CM

## 2022-09-18 DIAGNOSIS — M797 Fibromyalgia: Secondary | ICD-10-CM

## 2022-09-18 DIAGNOSIS — Z8269 Family history of other diseases of the musculoskeletal system and connective tissue: Secondary | ICD-10-CM

## 2022-09-18 MED ORDER — DULOXETINE HCL 60 MG PO CPEP: 60.0000 mg | ORAL_CAPSULE | Freq: Every day | ORAL | 0 refills | Status: DC

## 2022-09-18 MED ORDER — LIDOCAINE HCL 1 % IJ SOLN
1.5000 mL | INTRAMUSCULAR | Status: AC | PRN
  Administered 2022-09-18: 1.5 mL

## 2022-09-18 MED ORDER — METHOCARBAMOL 500 MG PO TABS: 500.0000 mg | ORAL_TABLET | Freq: Two times a day (BID) | ORAL | 0 refills | Status: DC | PRN

## 2022-09-18 MED ORDER — TRIAMCINOLONE ACETONIDE 40 MG/ML IJ SUSP
40.0000 mg | INTRAMUSCULAR | Status: AC | PRN
  Administered 2022-09-18: 40 mg via INTRA_ARTICULAR

## 2022-09-18 NOTE — Progress Notes (Signed)
X-rays of the left foot are consistent with early osteoarthritis.  No acute abnormality noted. Please notify the patient

## 2022-09-20 LAB — COMPLETE METABOLIC PANEL WITH GFR
AG Ratio: 1.3 (calc) (ref 1.0–2.5)
ALT: 11 U/L (ref 6–29)
AST: 11 U/L (ref 10–30)
Albumin: 3.9 g/dL (ref 3.6–5.1)
Alkaline phosphatase (APISO): 72 U/L (ref 31–125)
BUN: 8 mg/dL (ref 7–25)
CO2: 26 mmol/L (ref 20–32)
Calcium: 9.5 mg/dL (ref 8.6–10.2)
Chloride: 106 mmol/L (ref 98–110)
Creat: 0.67 mg/dL (ref 0.50–0.99)
Globulin: 3.1 g/dL (calc) (ref 1.9–3.7)
Glucose, Bld: 90 mg/dL (ref 65–99)
Potassium: 4.6 mmol/L (ref 3.5–5.3)
Sodium: 139 mmol/L (ref 135–146)
Total Bilirubin: 0.2 mg/dL (ref 0.2–1.2)
Total Protein: 7 g/dL (ref 6.1–8.1)
eGFR: 111 mL/min/{1.73_m2} (ref >=60)

## 2022-09-20 LAB — CBC WITH DIFFERENTIAL/PLATELET
Absolute Monocytes: 452 cells/uL (ref 200–950)
Basophils Absolute: 47 cells/uL (ref 0–200)
Basophils Relative: 0.6 %
Eosinophils Absolute: 140 cells/uL (ref 15–500)
Eosinophils Relative: 1.8 %
HCT: 37.3 % (ref 35.0–45.0)
Hemoglobin: 12 g/dL (ref 11.7–15.5)
Lymphs Abs: 3362 cells/uL (ref 850–3900)
MCH: 25.4 pg — ABNORMAL LOW (ref 27.0–33.0)
MCHC: 32.2 g/dL (ref 32.0–36.0)
MCV: 79 fL — ABNORMAL LOW (ref 80.0–100.0)
MPV: 11.8 fL (ref 7.5–12.5)
Monocytes Relative: 5.8 %
Neutro Abs: 3799 cells/uL (ref 1500–7800)
Neutrophils Relative %: 48.7 %
Platelets: 257 10*3/uL (ref 140–400)
RBC: 4.72 10*6/uL (ref 3.80–5.10)
RDW: 14.4 % (ref 11.0–15.0)
Total Lymphocyte: 43.1 %
WBC: 7.8 10*3/uL (ref 3.8–10.8)

## 2022-09-20 LAB — ANA: Anti Nuclear Antibody (ANA): POSITIVE — AB

## 2022-09-20 LAB — URINALYSIS, ROUTINE W REFLEX MICROSCOPIC
Bilirubin Urine: NEGATIVE
Glucose, UA: NEGATIVE
Hgb urine dipstick: NEGATIVE
Ketones, ur: NEGATIVE
Leukocytes,Ua: NEGATIVE
Nitrite: NEGATIVE
Protein, ur: NEGATIVE
Specific Gravity, Urine: 1.019 (ref 1.001–1.035)
pH: 6.5 (ref 5.0–8.0)

## 2022-09-20 LAB — ANTI-DNA ANTIBODY, DOUBLE-STRANDED: ds DNA Ab: <1 IU/mL

## 2022-09-20 LAB — C3 AND C4
C3 Complement: 177 mg/dL (ref 83–193)
C4 Complement: 45 mg/dL (ref 15–57)

## 2022-09-20 LAB — ANTI-NUCLEAR AB-TITER (ANA TITER): ANA Titer 1: 1:320 {titer} — ABNORMAL HIGH

## 2022-09-20 LAB — SEDIMENTATION RATE: Sed Rate: 50 mm/h — ABNORMAL HIGH (ref 0–20)

## 2022-09-20 NOTE — Progress Notes (Signed)
CMP WNL.CBC stable.  Complements WNL ESR remains elevated-stable.  UA normal.  ANA remains positive.  dsDNA is pending.

## 2022-09-20 NOTE — Progress Notes (Signed)
Double-stranded DNA is negative.

## 2022-12-22 ENCOUNTER — Other Ambulatory Visit: Payer: Self-pay | Admitting: Physician Assistant

## 2022-12-23 NOTE — Telephone Encounter (Signed)
Last Fill: 09/18/2022  Next Visit: 04/01/2023  Last Visit: 09/18/2022  Dx: Fibromyalgia   Current Dose per office note on 09/18/2022: Cymbalta 60 mg 1 capsule by mouth daily   Okay to refill Cymbalta?

## 2023-03-12 ENCOUNTER — Telehealth: Payer: Self-pay | Admitting: Rheumatology

## 2023-03-12 NOTE — Telephone Encounter (Signed)
Attempted to contact the patient and left message to advise patient her handicap placard renewal form is at the front desk for pick up.

## 2023-03-12 NOTE — Telephone Encounter (Signed)
Pt called requesting another handicap placard due to it expiring on the 31st of August.

## 2023-03-12 NOTE — Telephone Encounter (Signed)
Ok to renew handicap placard--long term is ok due to underlying chronic conditions.

## 2023-03-19 NOTE — Progress Notes (Signed)
Office Visit Note  Patient: [name]             Date of Birth: 1981/01/14           MRN: [id number]             PCP: Jarrett Soho, PA-C Referring: Jarrett Soho, PA-C Visit Date: 04/01/2023 Occupation: @GUAROCC @  Subjective:  Generalized pain and stiffness  History of Present Illness: [name] is a 42 y.o. female with history of fibromyalgia, positive ANA.  She states she was in the process of moving over the last 2 weeks.  With the packing and moving and cleaning she has been having increased pain and discomfort all over her body.  She states all her joints and muscles are achy.  She has been taking methocarbamol 500 mg twice daily and Cymbalta 60 mg p.o. daily.  She has not noticed any joint swelling.  She states for the last she has been having diarrhea for the last show for 5 days.  She states diarrhea alternates with the constipation.  She was seen at the urgent care for the diarrhea.  She was given a prescription for diphenoxylate but it is not helping her symptoms.  She denies any blood or mucus in the stool.    Activities of Daily Living:  Patient reports morning stiffness for 24 hours.   Patient Reports nocturnal pain.  Difficulty dressing/grooming: Reports Difficulty climbing stairs: Reports Difficulty getting out of chair: Reports Difficulty using hands for taps, buttons, cutlery, and/or writing: Denies  Review of Systems  Constitutional:  Positive for fatigue.  HENT:  Positive for mouth dryness. Negative for mouth sores.   Eyes:  Positive for dryness.  Respiratory:  Negative for shortness of breath.   Cardiovascular:  Negative for palpitations.  Gastrointestinal:  Positive for constipation and diarrhea. Negative for blood in stool.  Endocrine: Negative for increased urination.  Genitourinary:  Negative for involuntary urination.  Musculoskeletal:  Positive for joint pain, gait problem, joint pain, myalgias, morning stiffness, muscle tenderness and  myalgias. Negative for joint swelling and muscle weakness.  Skin:  Positive for sensitivity to sunlight. Negative for color change, rash and hair loss.  Allergic/Immunologic: Negative for susceptible to infections.  Neurological:  Negative for dizziness and headaches.  Hematological:  Negative for swollen glands.  Psychiatric/Behavioral:  Negative for depressed mood and sleep disturbance. The patient is not nervous/anxious.     PMFS History:  Patient Active Problem List   Diagnosis Date Noted   Protrusion of lumbar intervertebral disc 01/17/2022   Body mass index (BMI) 39.0-39.9, adult 05/23/2021   Fibromyalgia 05/03/2020   Chondromalacia of right patella 05/03/2020   Family history of systemic lupus erythematosus 09/16/2018   Hx of migraines 08/21/2018   Prediabetes 08/21/2018   Vitamin D deficiency 08/21/2018   History of carpal tunnel syndrome 08/21/2018    Past Medical History:  Diagnosis Date   Fibromyalgia    Healthy adult    Migraine     Family History  Problem Relation Age of Onset   Hypertension Mother    Diabetes Mother    Asthma Mother    Asthma Father    Heart attack Father    Urolithiasis Father    Hypertension Brother    Heart attack Brother    Diverticulitis Brother    Urolithiasis Brother    Asthma Brother    Healthy Daughter    Healthy Son    Past Surgical History:  Procedure Laterality Date   ABLATION  CHOLECYSTECTOMY     TONSILLECTOMY     TUBAL LIGATION     Social History   Social History Narrative   Lives with two children   Drinks caffeine every once in a while    Immunization History  Administered Date(s) Administered   Hepatitis B, ADULT 12/11/2017, 01/11/2018, 06/17/2018   Influenza Split 04/20/2013   PPD Test 12/11/2017   Tdap 10/01/2007, 09/22/2017     Objective: Vital Signs: BP 125/82 (BP Location: Left Arm, Patient Position: Sitting, Cuff Size: Normal)   Pulse 96   Resp 16   Ht 5' 2.5" (1.588 m)   Wt 236 lb (107 kg)    BMI 42.48 kg/m    Physical Exam Vitals and nursing note reviewed.  Constitutional:      Appearance: She is well-developed.  HENT:     Head: Normocephalic and atraumatic.  Eyes:     Conjunctiva/sclera: Conjunctivae normal.  Cardiovascular:     Rate and Rhythm: Normal rate and regular rhythm.     Heart sounds: Normal heart sounds.  Pulmonary:     Effort: Pulmonary effort is normal.     Breath sounds: Normal breath sounds.  Abdominal:     General: Bowel sounds are normal.     Palpations: Abdomen is soft.  Musculoskeletal:     Cervical back: Normal range of motion.  Lymphadenopathy:     Cervical: No cervical adenopathy.  Skin:    General: Skin is warm and dry.     Capillary Refill: Capillary refill takes less than 2 seconds.  Neurological:     Mental Status: She is alert and oriented to person, place, and time.  Psychiatric:        Behavior: Behavior normal.      Musculoskeletal Exam: Cervical spine was in good range of motion.  She had pain with range of motion of her lumbar spine.  Shoulder joints, elbow joints, wrist joints, MCPs PIPs and DIPs were in good range of motion with no synovitis.  Hip joints, knee joints, ankles, MTPs and PIPs were in good range of motion with no synovitis.  She had generalized hyperalgesia and positive tender points.  CDAI Exam: CDAI Score: -- Patient Global: --; Provider Global: -- Swollen: --; Tender: -- Joint Exam 04/01/2023   No joint exam has been documented for this visit   There is currently no information documented on the homunculus. Go to the Rheumatology activity and complete the homunculus joint exam.  Investigation: No additional findings.  Imaging: No results found.  Recent Labs: Lab Results  Component Value Date   WBC 7.8 09/18/2022   HGB 12.0 09/18/2022   PLT 257 09/18/2022   NA 139 09/18/2022   K 4.6 09/18/2022   CL 106 09/18/2022   CO2 26 09/18/2022   GLUCOSE 90 09/18/2022   BUN 8 09/18/2022   CREATININE  0.67 09/18/2022   BILITOT 0.2 09/18/2022   ALKPHOS 55 09/25/2014   AST 11 09/18/2022   ALT 11 09/18/2022   PROT 7.0 09/18/2022   ALBUMIN 3.5 09/25/2014   CALCIUM 9.5 09/18/2022   GFRAA 131 01/07/2020    Speciality Comments: No specialty comments available.  Procedures:  No procedures performed Allergies: Pineapple, Tinidazole, Amoxicillin-pot clavulanate, Other, and Biaxin [clarithromycin]   Assessment / Plan:     Visit Diagnoses: Fibromyalgia -patient is having a flare of fibromyalgia with increased pain and discomfort all over.  She states the pain exacerbated by the moving process.  She had to move boxes and lift heavy  objects.  She continues to be on Cymbalta 60 mg 1 capsule by mouth daily and methocarbamol 500 mg 1 tablet twice daily as needed for muscle spasms.  Positive ANA (antinuclear antibody) - ANA 1:320 Nuclear, dense fine speckled, Anti-CCP 37: Repeat CCP and body was negative.  Smith negative and dsDNA negative on 01/07/20.  Patient denies history of oral ulcers, nasal ulcers, malar rash, Photosensitivity, Raynaud's phenomenon or lymphadenopathy.  I will obtain autoimmune labs today.  Positive anti-CCP test - repeat CCP was negative on March 2023.  No clinical features of rheumatoid arthritis. U/s of both hands on 07/05/22 negative for synovitis.  History of carpal tunnel syndrome - Bilateral-mild median nerve entrapment at wrist revealed on NCV with EMG on 05/16/21.  Chronic pain of right knee-she continues to have intermittent pain and discomfort in her knee joints.  No warmth swelling or effusion was noted.  X-rays obtained at the last visit were unremarkable.  Pain in left foot-she comes as of discomfort in her left foot intermittently.  No synovitis was noted.  There was no tenderness on palpation.  DDD (degenerative disc disease), lumbar-she has been seeing severe pain and discomfort in her lower back and muscle spasm.  She had no point tenderness over the lumbar  spine.  She has been lifting heavy objects.  Patient requested a prednisone taper.  Will send a prednisone taper starting at 20 mg and taper by 5 mg every 2 days.  Side effects of prednisone including weight gain, diabetes, hypertension, heart disease, cataracts and osteoporosis were discussed at length.  Patient also requested a work excuse till Friday which was given.  Rash and other nonspecific skin eruption -patient states she gets current intermittent rash on her forearms.  No active rash was noted.  She has a dry scale on the dorsum of the right wrist.    Intermittent diarrhea -   She continues to have intermittent diarrhea and constipation.  Patient states she has not to schedule an appointment with the gastroenterologist yet.  She plans to schedule an appointment.  Diarrhea symptoms have been worse lately.  Other medical problems are listed as follows:  Vitamin D deficiency   Prediabetes  Hx of migraines  Family history of systemic lupus erythematosus  Orders: No orders of the defined types were placed in this encounter.  No orders of the defined types were placed in this encounter.    Follow-Up Instructions: Return in about 6 months (around 09/29/2023) for +ANA, FMS.   Pollyann Savoy, MD  Note - This record has been created using Animal nutritionist.  Chart creation errors have been sought, but may not always  have been located. Such creation errors do not reflect on  the standard of medical care.

## 2023-04-01 ENCOUNTER — Encounter: Payer: Self-pay | Admitting: *Deleted

## 2023-04-01 ENCOUNTER — Encounter: Payer: Self-pay | Admitting: Rheumatology

## 2023-04-01 ENCOUNTER — Ambulatory Visit: Payer: BC Managed Care – PPO | Attending: Rheumatology | Admitting: Rheumatology

## 2023-04-01 VITALS — BP 125/82 | HR 96 | Resp 16 | Ht 62.5 in | Wt 236.0 lb

## 2023-04-01 DIAGNOSIS — Z8269 Family history of other diseases of the musculoskeletal system and connective tissue: Secondary | ICD-10-CM

## 2023-04-01 DIAGNOSIS — R768 Other specified abnormal immunological findings in serum: Secondary | ICD-10-CM

## 2023-04-01 DIAGNOSIS — Z8669 Personal history of other diseases of the nervous system and sense organs: Secondary | ICD-10-CM | POA: Diagnosis not present

## 2023-04-01 DIAGNOSIS — M25561 Pain in right knee: Secondary | ICD-10-CM

## 2023-04-01 DIAGNOSIS — R7303 Prediabetes: Secondary | ICD-10-CM

## 2023-04-01 DIAGNOSIS — M797 Fibromyalgia: Secondary | ICD-10-CM | POA: Diagnosis not present

## 2023-04-01 DIAGNOSIS — R21 Rash and other nonspecific skin eruption: Secondary | ICD-10-CM

## 2023-04-01 DIAGNOSIS — M5136 Other intervertebral disc degeneration, lumbar region: Secondary | ICD-10-CM

## 2023-04-01 DIAGNOSIS — M2241 Chondromalacia patellae, right knee: Secondary | ICD-10-CM

## 2023-04-01 DIAGNOSIS — M79672 Pain in left foot: Secondary | ICD-10-CM

## 2023-04-01 DIAGNOSIS — M51369 Other intervertebral disc degeneration, lumbar region without mention of lumbar back pain or lower extremity pain: Secondary | ICD-10-CM

## 2023-04-01 DIAGNOSIS — G8929 Other chronic pain: Secondary | ICD-10-CM

## 2023-04-01 DIAGNOSIS — E559 Vitamin D deficiency, unspecified: Secondary | ICD-10-CM

## 2023-04-01 DIAGNOSIS — R197 Diarrhea, unspecified: Secondary | ICD-10-CM

## 2023-04-01 MED ORDER — METHOCARBAMOL 500 MG PO TABS: 500.0000 mg | ORAL_TABLET | Freq: Two times a day (BID) | ORAL | 0 refills | Status: AC | PRN

## 2023-04-01 MED ORDER — PREDNISONE 5 MG PO TABS: ORAL_TABLET | ORAL | 0 refills | Status: DC

## 2023-04-01 MED ORDER — DULOXETINE HCL 60 MG PO CPEP: 60.0000 mg | ORAL_CAPSULE | Freq: Every day | ORAL | 0 refills | Status: DC

## 2023-04-02 LAB — CBC WITH DIFFERENTIAL/PLATELET
Absolute Monocytes: 602 {cells}/uL (ref 200–950)
Basophils Absolute: 60 {cells}/uL (ref 0–200)
Basophils Relative: 0.7 %
Eosinophils Absolute: 172 {cells}/uL (ref 15–500)
Eosinophils Relative: 2 %
HCT: 36.6 % (ref 35.0–45.0)
Hemoglobin: 11.8 g/dL (ref 11.7–15.5)
Lymphs Abs: 3165 {cells}/uL (ref 850–3900)
MCH: 25.4 pg — ABNORMAL LOW (ref 27.0–33.0)
MCHC: 32.2 g/dL (ref 32.0–36.0)
MCV: 78.9 fL — ABNORMAL LOW (ref 80.0–100.0)
MPV: 12.1 fL (ref 7.5–12.5)
Monocytes Relative: 7 %
Neutro Abs: 4601 {cells}/uL (ref 1500–7800)
Neutrophils Relative %: 53.5 %
Platelets: 254 10*3/uL (ref 140–400)
RBC: 4.64 10*6/uL (ref 3.80–5.10)
RDW: 15 % (ref 11.0–15.0)
Total Lymphocyte: 36.8 %
WBC: 8.6 10*3/uL (ref 3.8–10.8)

## 2023-04-02 LAB — SEDIMENTATION RATE: Sed Rate: 45 mm/h — ABNORMAL HIGH (ref 0–20)

## 2023-04-02 NOTE — Progress Notes (Signed)
CBC and CMP are stable.  Sed rate remains elevated but better than before.

## 2023-04-03 NOTE — Progress Notes (Signed)
Double-stranded DNA negative, complements normal.  Labs do not indicate lupus.

## 2023-08-01 IMAGING — MR MR LUMBAR SPINE W/O CM
4 of 5 series · 26 of 48 positions shown · non-contrast
Comparison: Prior radiograph from 07/18/2021.

CLINICAL DATA: Initial evaluation for low back pain since Monday June, 2021.

EXAM:
MRI LUMBAR SPINE WITHOUT CONTRAST
TECHNIQUE: Multiplanar, multisequence MR imaging of the lumbar spine was
performed. No intravenous contrast was administered.

[Series 3: T2 · sagittal · 4.0mm · 0.53mm/px · 6 of 15 slices shown (1 of 2)]
[im 1/15]
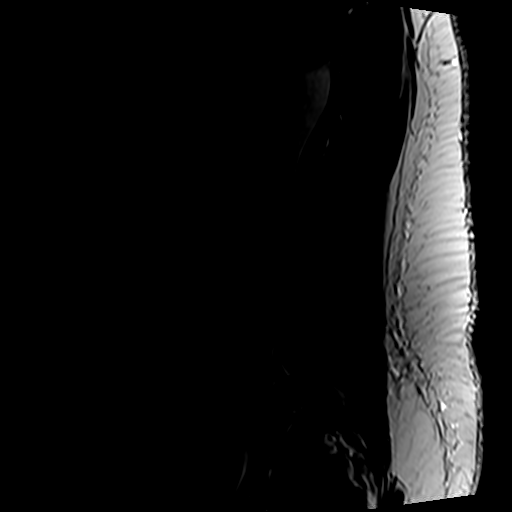
[im 3/15]
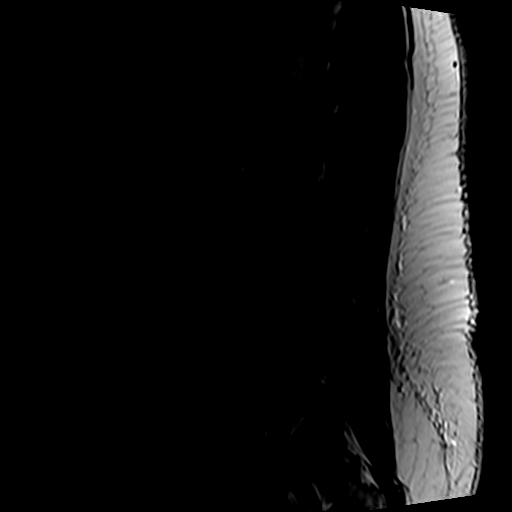
[im 6/15]
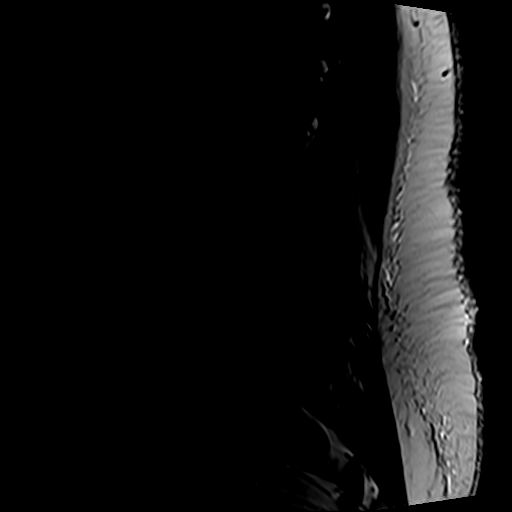
[im 9/15]
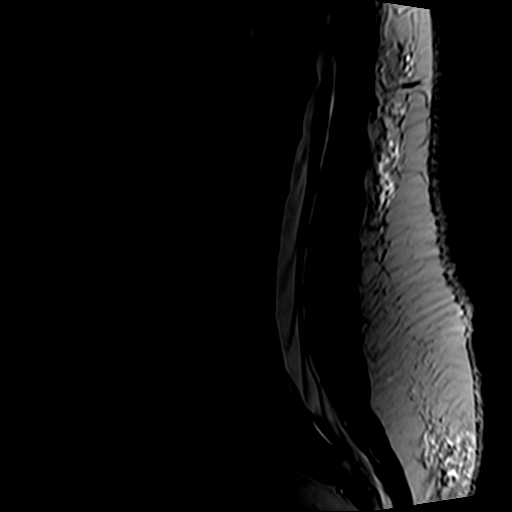
[im 12/15]
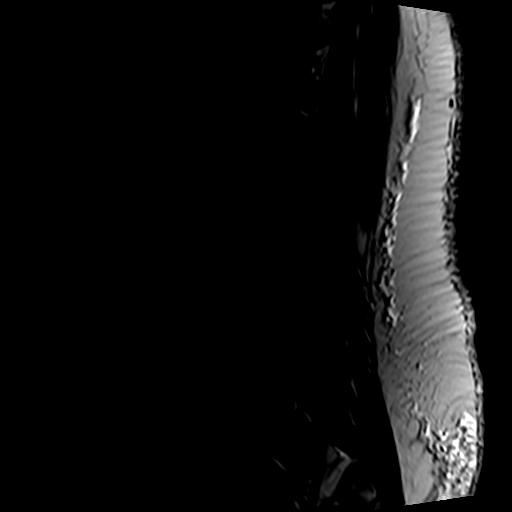
[im 15/15]
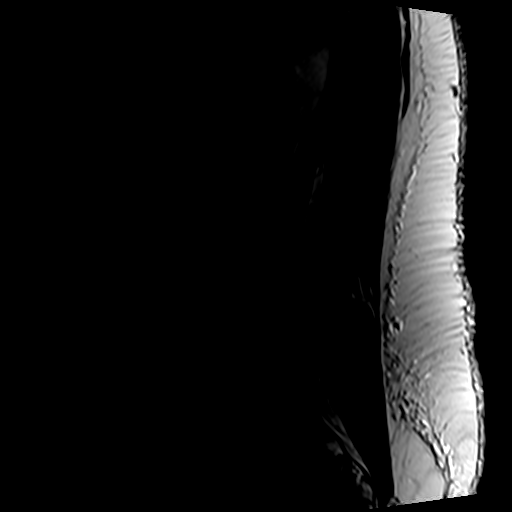

[Series 5: T1 · sagittal · 4.0mm · 0.53mm/px · 6 of 15 slices shown (1 of 2)]
[im 1/15]
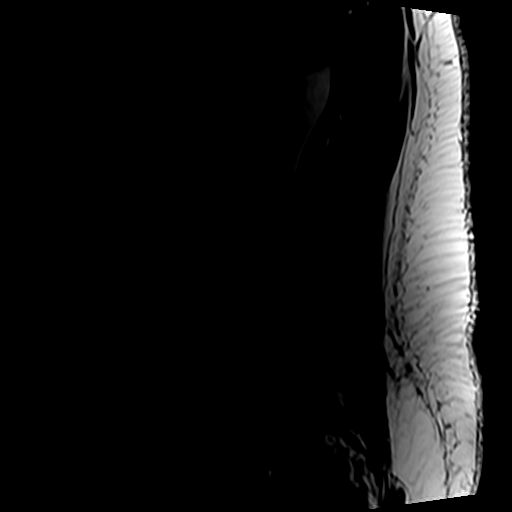
[im 3/15]
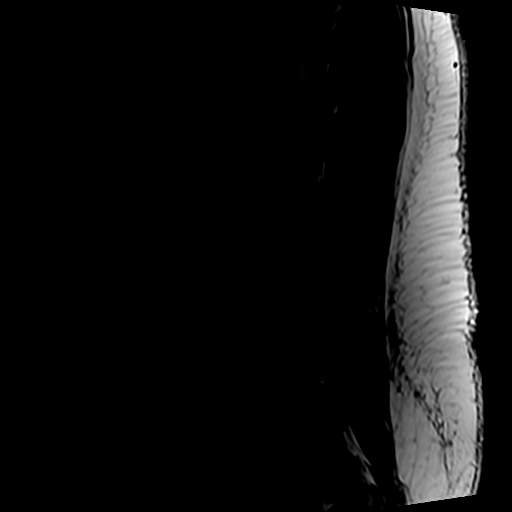
[im 6/15]
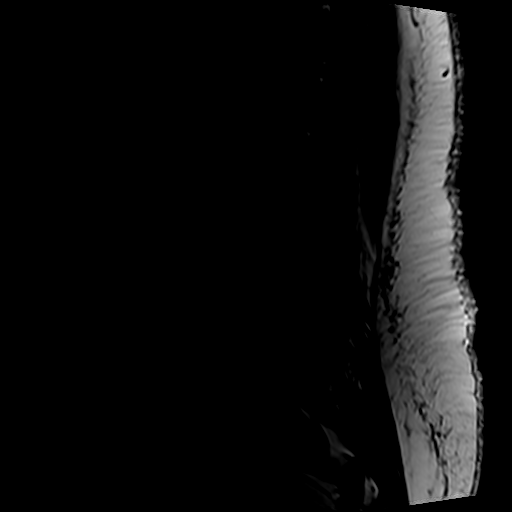
[im 9/15]
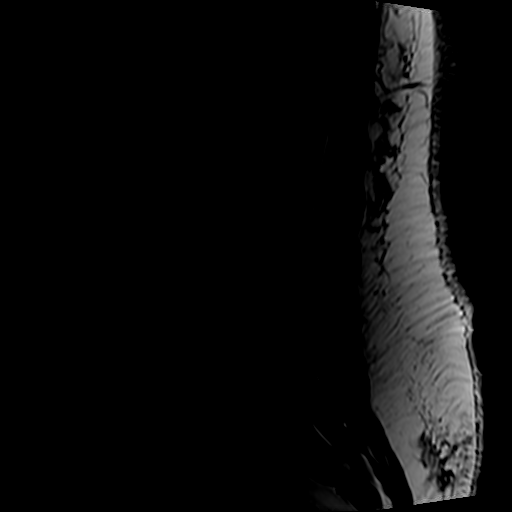
[im 12/15]
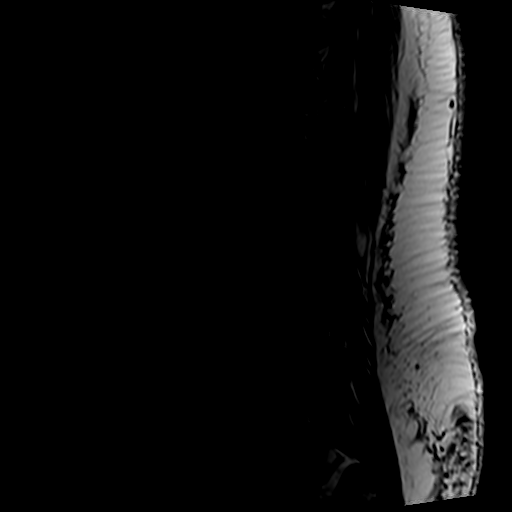
[im 15/15]
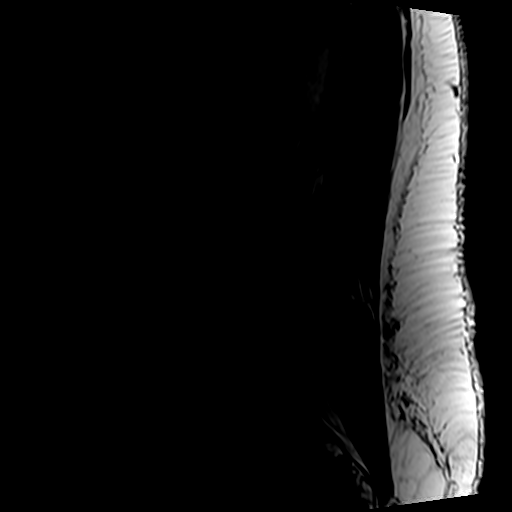

[Series 6: T2 · axial · 4.0mm · 0.70mm/px · z∈[+50,+262]mm · 9 of 35 slices shown (2 of 2)]
[im 1/35]
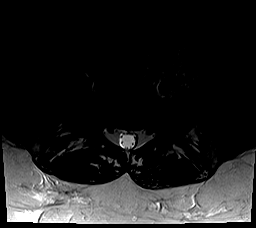
[im 5/35]
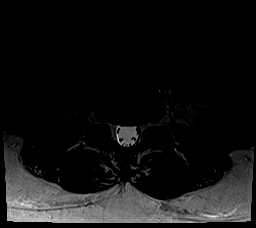
[im 10/35]
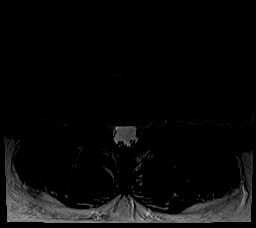
[im 15/35]
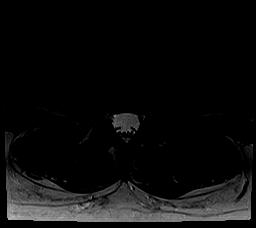
[im 18/35]
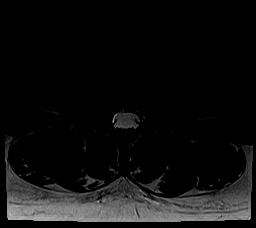
[im 20/35]
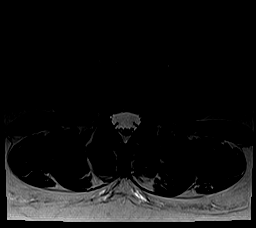
[im 25/35]
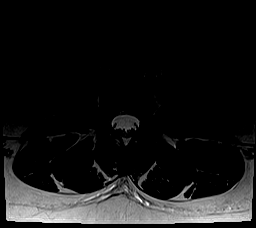
[im 30/35]
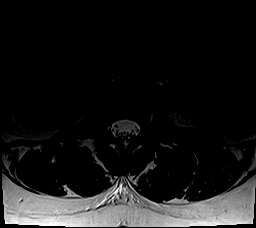
[im 35/35]
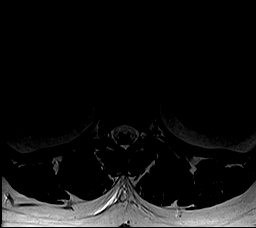

[Series 7: T1 · axial · 4.0mm · 0.35mm/px · z∈[+50,+236]mm · 5 of 35 slices shown (2 of 2)]
[im 1/35]
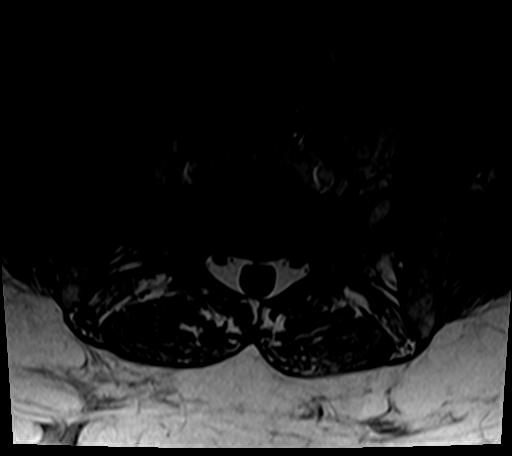
[im 5/35]
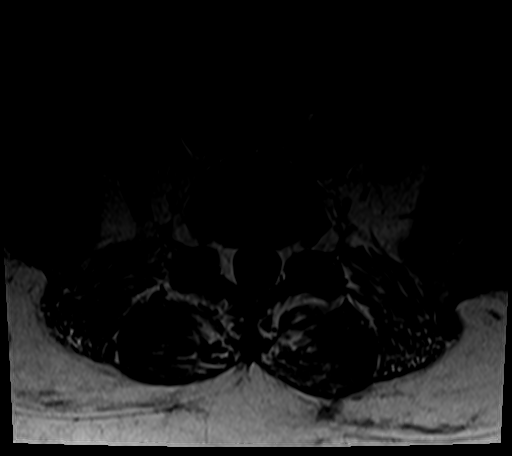
[im 10/35]
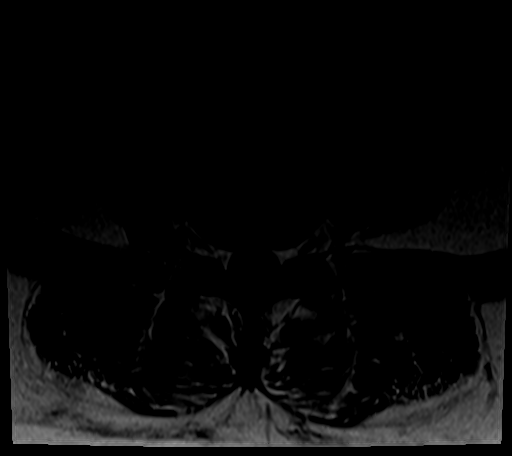
[im 18/35]
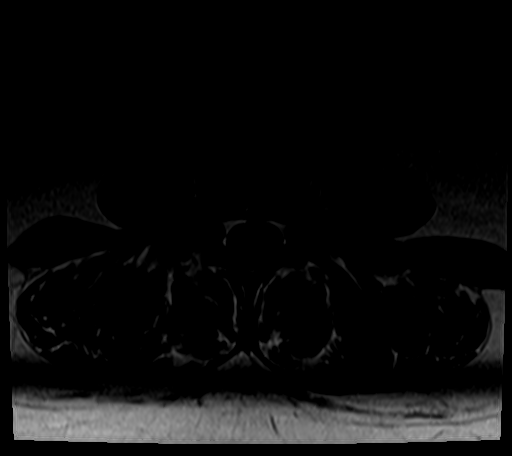
[im 30/35]
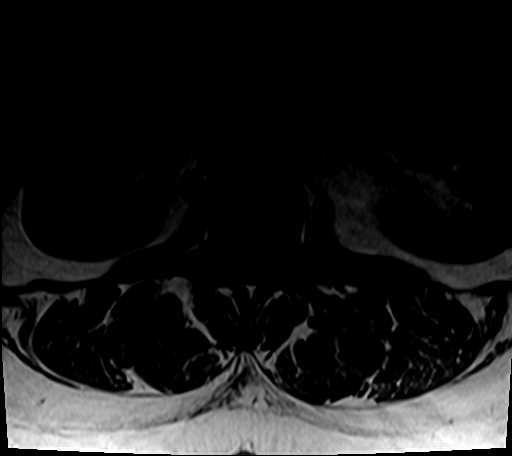

[26 of 48 positions shown; findings below may reference images not displayed]

FINDINGS: Segmentation: Standard. Lowest well-formed disc space labeled the
L5-S1 level.

Alignment: Physiologic with preservation of the normal lumbar
lordosis. No listhesis.

Vertebrae: Vertebral body height maintained without acute or chronic
fracture. Bone marrow signal intensity somewhat heterogeneous but
overall within normal limits. Few small benign hemangiomata noted.
No worrisome osseous lesions or abnormal marrow edema.

Conus medullaris and cauda equina: Conus extends to the T12-L1
level. Conus and cauda equina appear normal.

Paraspinal and other soft tissues: Unremarkable.

Disc levels:

T11-12: Seen only on sagittal projection. Small right paracentral
disc protrusion indents the right ventral thecal sac (series 3,
image 7). No significant spinal stenosis. Foramina remain patent.

T12-L1: Unremarkable.

L1-2:  Unremarkable.

L2-3:  Unremarkable.

L3-4: Negative interspace. Mild right-sided facet hypertrophy. No
canal or foraminal stenosis.

L4-5: Mild far lateral disc bulging. Mild bilateral facet
hypertrophy. No canal or foraminal stenosis.

L5-S1: Tiny central disc extrusion with slight superior migration
(series 7, image 30). Mild facet hypertrophy. No canal or lateral
recess stenosis. Foramina remain patent. No impingement.
IMPRESSION: 1. Tiny central disc extrusion with superior migration at L5-S1
without stenosis or neural impingement.
2. Small right paracentral disc protrusion at T11-12 without
stenosis.
3. Mild bilateral facet hypertrophy at L3-4 through L5-S1.

## 2023-09-12 ENCOUNTER — Other Ambulatory Visit: Payer: Self-pay

## 2023-09-12 NOTE — Telephone Encounter (Signed)
 Patient contacted the office and would like a refill of Cymbalta sent to [facility] on [address].   Last Fill: 04/01/2023  Next Visit: 09/29/2023  Last Visit: 04/01/2023  Dx: Fibromyalgia   Current Dose per office note on 04/01/2023: Cymbalta 60 mg 1 capsule by mouth daily   Okay to refill Cymbalta?

## 2023-09-15 MED ORDER — DULOXETINE HCL 60 MG PO CPEP: 60.0000 mg | ORAL_CAPSULE | Freq: Every day | ORAL | 0 refills | Status: DC

## 2023-09-15 NOTE — Progress Notes (Signed)
 Office Visit Note  Patient: [name]             Date of Birth: 09-22-1980           MRN: [id number]             PCP: Jarrett Soho, PA-C Referring: Jarrett Soho, PA-C Visit Date: 09/29/2023 Occupation: @GUAROCC @  Subjective:  Pain and stiffness involving multiple joints   History of Present Illness: [name] is a 43 y.o. female with history of fibromyalgia.   Patient remains on Cymbalta 60 mg 1 capsule by mouth daily.  Patient states that she discontinued methocarbamol after her last office visit due to experiencing GI upset and diarrhea.  Her GI symptoms have resolved so she has no longer been taking methocarbamol consistently.  Patient presents today with increased pain and stiffness involving multiple joints.  Patient is having increased myalgias and muscle tenderness consistent with fibromyalgia as well.  She has also had a recurrence of pain in her right knee.  Her right knee joint pain is exacerbated by climbing steps as well as rising from a seated position.  She denies any swelling in her right knee joint at this time. She had a right knee joint cortisone injection performed on 09/18/2022. Patient states has been experiencing significant fatigue and daytime drowsiness.  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  Patient states that she sleeps 4 hours per 7 hours she still does not feel that she is rested.  Patient states that she has gained weight due to having to be more sedentary due to her level of fatigue and pain.  Activities of Daily Living:  Patient reports joint stiffness all day.  Patient Reports nocturnal pain.  Difficulty dressing/grooming: Reports Difficulty climbing stairs: Reports Difficulty getting out of chair: Denies Difficulty using hands for taps, buttons, cutlery, and/or writing: Reports  Review of Systems  Constitutional:  Positive for fatigue.  HENT:  Positive for mouth dryness.  Negative for mouth sores.   Eyes:  Positive for dryness.  Respiratory:  Negative for shortness of breath.   Cardiovascular:  Negative for chest pain and palpitations.  Gastrointestinal:  Negative for blood in stool, constipation and diarrhea.  Endocrine: Negative for increased urination.  Genitourinary:  Negative for involuntary urination.  Musculoskeletal:  Positive for joint pain, joint pain, myalgias, muscle weakness, morning stiffness, muscle tenderness and myalgias. Negative for gait problem and joint swelling.  Skin:  Positive for color change and sensitivity to sunlight. Negative for rash and hair loss.  Allergic/Immunologic: Positive for susceptible to infections.  Neurological:  Negative for dizziness and headaches.  Hematological:  Negative for swollen glands.  Psychiatric/Behavioral:  Positive for sleep disturbance. Negative for depressed mood. The patient is not nervous/anxious.     PMFS History:  Patient Active Problem List   Diagnosis Date Noted   Protrusion of lumbar intervertebral disc 01/17/2022   Body mass index (BMI) 39.0-39.9, adult 05/23/2021   Fibromyalgia 05/03/2020   Chondromalacia of right patella 05/03/2020   Family history of systemic lupus erythematosus 09/16/2018   Hx of migraines 08/21/2018   Prediabetes 08/21/2018   Vitamin D deficiency 08/21/2018   History of carpal tunnel syndrome 08/21/2018    Past Medical History:  Diagnosis Date   Fibromyalgia    Healthy adult    Migraine     Family History  Problem Relation Age of Onset   Hypertension Mother    Diabetes Mother  Asthma Mother    Asthma Father    Heart attack Father    Urolithiasis Father    Hypertension Brother    Heart attack Brother    Diverticulitis Brother    Urolithiasis Brother    Asthma Brother    Healthy Daughter    Healthy Son    Past Surgical History:  Procedure Laterality Date   ABLATION     CHOLECYSTECTOMY     TONSILLECTOMY     TUBAL LIGATION     Social History    Social History Narrative   Lives with two children   Drinks caffeine every once in a while    Immunization History  Administered Date(s) Administered   Hepatitis B, ADULT 12/11/2017, 01/11/2018, 06/17/2018   Influenza Split 04/20/2013   PPD Test 12/11/2017   Tdap 10/01/2007, 09/22/2017     Objective: Vital Signs: BP 130/81 (BP Location: Left Arm, Patient Position: Sitting, Cuff Size: Large)   Pulse (!) 105   Resp 14   Ht 5' 2.5" (1.588 m)   Wt 238 lb (108 kg)   BMI 42.84 kg/m    Physical Exam Vitals and nursing note reviewed.  Constitutional:      Appearance: She is well-developed.  HENT:     Head: Normocephalic and atraumatic.  Eyes:     Conjunctiva/sclera: Conjunctivae normal.  Cardiovascular:     Rate and Rhythm: Normal rate and regular rhythm.     Heart sounds: Normal heart sounds.  Pulmonary:     Effort: Pulmonary effort is normal.     Breath sounds: Normal breath sounds.  Abdominal:     General: Bowel sounds are normal.     Palpations: Abdomen is soft.  Musculoskeletal:     Cervical back: Normal range of motion.  Lymphadenopathy:     Cervical: No cervical adenopathy.  Skin:    General: Skin is warm and dry.     Capillary Refill: Capillary refill takes less than 2 seconds.  Neurological:     Mental Status: She is alert and oriented to person, place, and time.  Psychiatric:        Behavior: Behavior normal.      Musculoskeletal Exam: Generalized hyperalgesia and positive tender points on exam.  C-spine has good range of motion with some trapezius muscle tension tenderness bilaterally.  Discomfort with range of motion of the lumbar spine.  Elbow joints have good range of motion with some tenderness over the lateral epicondyle of the right elbow.  Wrist joints, MCPs, PIPs, DIPs have good range of motion with no synovitis.  Complete fist formation bilaterally.  Hip joints have good range of motion with no groin pain.  Painful range of motion of the right knee  but no warmth or effusion noted.  Ankle joints have good range of motion with no tenderness or joint swelling.  CDAI Exam: CDAI Score: -- Patient Global: --; Provider Global: -- Swollen: --; Tender: -- Joint Exam 09/29/2023   No joint exam has been documented for this visit   There is currently no information documented on the homunculus. Go to the Rheumatology activity and complete the homunculus joint exam.  Investigation: No additional findings.  Imaging: No results found.  Recent Labs: Lab Results  Component Value Date   WBC 8.6 04/01/2023   HGB 11.8 04/01/2023   PLT 254 04/01/2023   NA 140 04/01/2023   K 4.1 04/01/2023   CL 104 04/01/2023   CO2 28 04/01/2023   GLUCOSE 98 04/01/2023   BUN  9 04/01/2023   CREATININE 0.74 04/01/2023   BILITOT 0.2 04/01/2023   ALKPHOS 55 09/25/2014   AST 23 04/01/2023   ALT 22 04/01/2023   PROT 6.8 04/01/2023   ALBUMIN 3.5 09/25/2014   CALCIUM 9.1 04/01/2023   GFRAA 131 01/07/2020    Speciality Comments: No specialty comments available.  Procedures:  Large Joint Inj: R knee on 09/29/2023 10:41 AM Indications: pain Details: 27 G 1.5 in needle, medial approach  Arthrogram: No  Medications: 1.5 mL lidocaine 1 %; 40 mg triamcinolone acetonide 40 MG/ML Aspirate: 0 mL Outcome: tolerated well, no immediate complications Procedure, treatment alternatives, risks and benefits explained, specific risks discussed. Consent was given by the patient. Immediately prior to procedure a time out was called to verify the correct patient, procedure, equipment, support staff and site/side marked as required. Patient was prepped and draped in the usual sterile fashion.     Allergies: Pineapple, Tinidazole, Amoxicillin-pot clavulanate, Other, and Biaxin [clarithromycin]   Assessment / Plan:     Visit Diagnoses: Fibromyalgia - She has generalized hyperalgesia and positive tender points on exam.  She has been experiencing increased myofascial pain  especially on the right side of her body.  She is no longer taking methocarbamol which was discontinued due to GI upset and diarrhea.  She remains on Cymbalta 60 mg 1 capsule by mouth daily. Discussed that she may benefit from a referral to pain management.   A referral to integrative therapies was placed today to help alleviate some of the myofascial pain she is experiencing. One of her biggest concerns has been the level of fatigue she experiences on a daily basis.  She has a difficult time staying awake while driving which has started to concern her.  According to the patient does not matter if she sleeps 4 hours wears 10 hours she does not feel restored when she wakes up.  Due to the level of fatigue and excessive daytime drowsiness she may benefit from a sleep study for further evaluation-patient plans on discussing with her PCP at her upcoming appointment. Discussed the importance of regular exercise and good sleep hygiene.  She has had a difficult time exercising and had to be more sedentary than usual due to the severity of pain and fatigue.  She may benefit from gradual aerobic exercise to help minimize the frequency and severity of fibromyalgia flares. She will follow-up in the office in 6 months or sooner if needed.  Positive ANA (antinuclear antibody) - ANA 1:320 Nuclear, dense fine speckled, Anti-CCP 37: Repeat CCP and body was negative.  Smith negative and dsDNA negative on 01/07/20.  Patient is been experiencing increased myofascial pain and arthralgias.  No obvious synovitis was noted.  No other new features of autoimmune disease have been noted.  Plan to obtain the following lab work today for further evaluation.- Plan: Anti-DNA antibody, double-stranded, C3 and C4, Sedimentation rate, ANA, CBC with Differential/Platelet, COMPLETE METABOLIC PANEL WITH GFR, Rheumatoid factor, Cyclic citrul peptide antibody, IgG, Mutated Citrullinated Vimentin (MCV) Antibody, C-reactive protein  Polyarthralgia  - Patient presents today with increased pain involving multiple joints.  Her pain has been most severe in the joints involving her right side especially her right elbow, right hand, and right knee joint.  She has not been taking any over-the-counter products for pain relief.  She had no obvious synovitis on examination today.  The following lab work will be obtained today for further evaluation.   Referral to integrative therapy as well as to be placed.  Plan: Anti-DNA antibody, double-stranded, C3 and C4, Sedimentation rate, ANA, CBC with Differential/Platelet, COMPLETE METABOLIC PANEL WITH GFR, Rheumatoid factor, Cyclic citrul peptide antibody, IgG, Mutated Citrullinated Vimentin (MCV) Antibody, C-reactive protein  Positive anti-CCP test -Anti-CCP negative on 09/13/2021.  Ultrasound of both hands from 07/05/2022 was negative.   Patient presents today with a recurrence of pain involving multiple joints.  No obvious synovitis was noted on examination today.  Due to chronicity and severity of pain as well as history of elevated sed rate the following lab work will be repeated today for further evaluation.  Plan: Sedimentation rate, Rheumatoid factor, Cyclic citrul peptide antibody, IgG, Mutated Citrullinated Vimentin (MCV) Antibody, C-reactive protein  History of carpal tunnel syndrome - Bilateral-mild median nerve entrapment at wrist revealed on NCV with EMG on 05/16/21.  She experiences intermittent paresthesias in the right hand.  No inflammation noted on examination.   Chronic pain of right knee -Patient presents today with a recurrence of pain in the right knee.  No recent injury or fall.  She has been experiencing discomfort especially when climbing steps but also while weightbearing.  While weightbearing her pain is a 5 out of 10 on the pain scale.  She had a right knee joint cortisone injection performed on 09/18/2022 joint significant relief but her symptoms have recurred.  Different treatment options were  discussed today.  X-rays of the right knee were updated today.  She requested a repeat right knee joint cortisone injection today.  She tolerated the procedure well.  Procedure note was completed above.  Aftercare was discussed.   Plan: Large Joint Inj: R knee, XR KNEE 3 VIEW RIGHT  Degeneration of intervertebral disc of lumbar region without discogenic back pain or lower extremity pain -Previously had an epidural steroid injection performed on 12/18/2021.  She may benefit from a referral to pain management.  Referral to integrative therapies was placed today.  Other medical conditions are listed as follows:   Intermittent diarrhea: Resolved after discontinuing methocarbamol.   Vitamin D deficiency  Prediabetes  Hx of migraines  Family history of systemic lupus erythematosus    Orders: Orders Placed This Encounter  Procedures   Large Joint Inj: R knee   XR KNEE 3 VIEW RIGHT   Anti-DNA antibody, double-stranded   C3 and C4   Sedimentation rate   ANA   CBC with Differential/Platelet   COMPLETE METABOLIC PANEL WITH GFR   Rheumatoid factor   Cyclic citrul peptide antibody, IgG   Mutated Citrullinated Vimentin (MCV) Antibody   C-reactive protein   No orders of the defined types were placed in this encounter.    Follow-Up Instructions: Return in about 6 months (around 03/31/2024) for Fibromyalgia.   Gearldine Bienenstock, PA-C  Note - This record has been created using Dragon software.  Chart creation errors have been sought, but may not always  have been located. Such creation errors do not reflect on  the standard of medical care.

## 2023-09-16 ENCOUNTER — Other Ambulatory Visit: Payer: Self-pay | Admitting: Gastroenterology

## 2023-09-16 DIAGNOSIS — D509 Iron deficiency anemia, unspecified: Secondary | ICD-10-CM

## 2023-09-29 ENCOUNTER — Ambulatory Visit

## 2023-09-29 ENCOUNTER — Ambulatory Visit: Payer: BC Managed Care – PPO | Attending: Physician Assistant | Admitting: Physician Assistant

## 2023-09-29 ENCOUNTER — Encounter: Payer: Self-pay | Admitting: Physician Assistant

## 2023-09-29 VITALS — BP 130/81 | HR 105 | Resp 14 | Ht 62.5 in | Wt 238.0 lb

## 2023-09-29 DIAGNOSIS — M79672 Pain in left foot: Secondary | ICD-10-CM

## 2023-09-29 DIAGNOSIS — M2550 Pain in unspecified joint: Secondary | ICD-10-CM

## 2023-09-29 DIAGNOSIS — R7303 Prediabetes: Secondary | ICD-10-CM

## 2023-09-29 DIAGNOSIS — E559 Vitamin D deficiency, unspecified: Secondary | ICD-10-CM

## 2023-09-29 DIAGNOSIS — Z8669 Personal history of other diseases of the nervous system and sense organs: Secondary | ICD-10-CM

## 2023-09-29 DIAGNOSIS — G8929 Other chronic pain: Secondary | ICD-10-CM

## 2023-09-29 DIAGNOSIS — R768 Other specified abnormal immunological findings in serum: Secondary | ICD-10-CM

## 2023-09-29 DIAGNOSIS — R7689 Other specified abnormal immunological findings in serum: Secondary | ICD-10-CM

## 2023-09-29 DIAGNOSIS — M51369 Other intervertebral disc degeneration, lumbar region without mention of lumbar back pain or lower extremity pain: Secondary | ICD-10-CM

## 2023-09-29 DIAGNOSIS — M797 Fibromyalgia: Secondary | ICD-10-CM | POA: Diagnosis not present

## 2023-09-29 DIAGNOSIS — M25561 Pain in right knee: Secondary | ICD-10-CM

## 2023-09-29 DIAGNOSIS — Z8269 Family history of other diseases of the musculoskeletal system and connective tissue: Secondary | ICD-10-CM

## 2023-09-29 DIAGNOSIS — R7681 Abnormal rheumatoid factor and anti-citrullinated protein antibody without rheumatoid arthritis: Secondary | ICD-10-CM

## 2023-09-29 DIAGNOSIS — R197 Diarrhea, unspecified: Secondary | ICD-10-CM

## 2023-09-29 MED ORDER — LIDOCAINE HCL 1 % IJ SOLN
1.5000 mL | INTRAMUSCULAR | Status: AC | PRN
  Administered 2023-09-29: 1.5 mL

## 2023-09-29 MED ORDER — TRIAMCINOLONE ACETONIDE 40 MG/ML IJ SUSP
40.0000 mg | INTRAMUSCULAR | Status: AC | PRN
  Administered 2023-09-29: 40 mg via INTRA_ARTICULAR

## 2023-09-29 NOTE — Progress Notes (Signed)
 X-rays were unremarkable. Please notify the patient.

## 2023-09-29 NOTE — Addendum Note (Signed)
 Addended by: Geroge Baseman C on: 09/29/2023 12:06 PM   Modules accepted: Orders

## 2023-09-30 LAB — COMPLETE METABOLIC PANEL WITH GFR
AG Ratio: 1.1 (calc) (ref 1.0–2.5)
ALT: 15 U/L (ref 6–29)
AST: 12 U/L (ref 10–30)
Albumin: 3.8 g/dL (ref 3.6–5.1)
Alkaline phosphatase (APISO): 73 U/L (ref 31–125)
BUN: 7 mg/dL (ref 7–25)
CO2: 29 mmol/L (ref 20–32)
Calcium: 9.6 mg/dL (ref 8.6–10.2)
Chloride: 101 mmol/L (ref 98–110)
Creat: 0.66 mg/dL (ref 0.50–0.99)
Globulin: 3.4 g/dL (ref 1.9–3.7)
Glucose, Bld: 133 mg/dL — ABNORMAL HIGH (ref 65–99)
Potassium: 4.4 mmol/L (ref 3.5–5.3)
Sodium: 139 mmol/L (ref 135–146)
Total Bilirubin: 0.2 mg/dL (ref 0.2–1.2)
Total Protein: 7.2 g/dL (ref 6.1–8.1)
eGFR: 111 mL/min/{1.73_m2} (ref >=60)

## 2023-09-30 NOTE — Progress Notes (Signed)
 RF negative  ESR remains elevated and has increased-112. CRP is elevated.   CBC stable.  Glucose is 133. Rest of CMP WNL.

## 2023-10-01 NOTE — Progress Notes (Signed)
 Anti-CCP negative.

## 2023-10-02 LAB — CBC WITH DIFFERENTIAL/PLATELET
Absolute Lymphocytes: 2681 {cells}/uL (ref 850–3900)
Absolute Monocytes: 381 {cells}/uL (ref 200–950)
Basophils Absolute: 49 {cells}/uL (ref 0–200)
Basophils Relative: 0.6 %
Eosinophils Absolute: 130 {cells}/uL (ref 15–500)
Eosinophils Relative: 1.6 %
HCT: 39.9 % (ref 35.0–45.0)
Hemoglobin: 12.6 g/dL (ref 11.7–15.5)
MCH: 24.9 pg — ABNORMAL LOW (ref 27.0–33.0)
MCHC: 31.6 g/dL — ABNORMAL LOW (ref 32.0–36.0)
MCV: 78.7 fL — ABNORMAL LOW (ref 80.0–100.0)
MPV: 11.3 fL (ref 7.5–12.5)
Monocytes Relative: 4.7 %
Neutro Abs: 4860 {cells}/uL (ref 1500–7800)
Neutrophils Relative %: 60 %
Platelets: 287 10*3/uL (ref 140–400)
RBC: 5.07 10*6/uL (ref 3.80–5.10)
RDW: 14.5 % (ref 11.0–15.0)
Total Lymphocyte: 33.1 %
WBC: 8.1 10*3/uL (ref 3.8–10.8)

## 2023-10-02 LAB — ANTI-DNA ANTIBODY, DOUBLE-STRANDED: ds DNA Ab: <1 [IU]/mL

## 2023-10-02 LAB — C3 AND C4
C3 Complement: 212 mg/dL — ABNORMAL HIGH (ref 83–193)
C4 Complement: 38 mg/dL (ref 15–57)

## 2023-10-02 LAB — COMPREHENSIVE METABOLIC PANEL
AG Ratio: 1.1 (calc) (ref 1.0–2.5)
ALT: 15 U/L (ref 6–29)
AST: 12 U/L (ref 10–30)
Albumin: 3.8 g/dL (ref 3.6–5.1)
Alkaline phosphatase (APISO): 73 U/L (ref 31–125)
BUN: 7 mg/dL (ref 7–25)
CO2: 29 mmol/L (ref 20–32)
Calcium: 9.6 mg/dL (ref 8.6–10.2)
Chloride: 101 mmol/L (ref 98–110)
Creat: 0.66 mg/dL (ref 0.50–0.99)
Globulin: 3.4 g/dL (ref 1.9–3.7)
Glucose, Bld: 133 mg/dL — ABNORMAL HIGH (ref 65–99)
Potassium: 4.4 mmol/L (ref 3.5–5.3)
Sodium: 139 mmol/L (ref 135–146)
Total Bilirubin: 0.2 mg/dL (ref 0.2–1.2)
Total Protein: 7.2 g/dL (ref 6.1–8.1)
eGFR: 111 mL/min/{1.73_m2} (ref >=60)

## 2023-10-02 LAB — ANTI-NUCLEAR AB-TITER (ANA TITER): ANA Titer 1: 1:80 {titer} — ABNORMAL HIGH

## 2023-10-02 LAB — MUTATED CITRULLINATED VIMENTIN (MCV) ANTIBODY: MUTATED CITRULLINATED VIMENTIN (MCV) AB: <20 U/mL (ref <20)

## 2023-10-02 LAB — C-REACTIVE PROTEIN: CRP: 38.2 mg/L — ABNORMAL HIGH (ref <8.0)

## 2023-10-02 LAB — SEDIMENTATION RATE: Sed Rate: 112 mm/h — ABNORMAL HIGH (ref 0–20)

## 2023-10-02 LAB — ANA: Anti Nuclear Antibody (ANA): POSITIVE — AB

## 2023-10-02 LAB — CYCLIC CITRUL PEPTIDE ANTIBODY, IGG: Cyclic Citrullin Peptide Ab: <16 U

## 2023-10-02 LAB — RHEUMATOID FACTOR: Rheumatoid fact SerPl-aCnc: <10 [IU]/mL (ref <14)

## 2023-10-02 NOTE — Progress Notes (Signed)
 dsDNA negative.

## 2023-10-02 NOTE — Progress Notes (Signed)
 MCV negative

## 2023-10-06 ENCOUNTER — Encounter: Payer: Self-pay | Admitting: Gastroenterology

## 2023-10-10 ENCOUNTER — Other Ambulatory Visit: Payer: Self-pay

## 2023-10-30 ENCOUNTER — Ambulatory Visit
Admission: RE | Admit: 2023-10-30 | Discharge: 2023-10-30 | Disposition: A | Discharge status: Home / Self Care | Payer: Self-pay | Source: Ambulatory Visit | Attending: Gastroenterology | Admitting: Gastroenterology

## 2023-10-30 DIAGNOSIS — D509 Iron deficiency anemia, unspecified: Secondary | ICD-10-CM

## 2023-12-11 ENCOUNTER — Other Ambulatory Visit: Payer: Self-pay | Admitting: Physician Assistant

## 2023-12-11 NOTE — Telephone Encounter (Signed)
 Last Fill: 09/15/2023  Next Visit: 04/07/2024  Last Visit: 09/29/2023  Dx: Fibromyalgia   Current Dose per office note on 09/29/2023: Cymbalta  60 mg 1 capsule by mouth daily.   Okay to refill Cymbalta ?

## 2024-03-25 NOTE — Progress Notes (Signed)
 Office Visit Note  Patient: [name]             Date of Birth: 07-Feb-1981           MRN: [id number]             PCP: Katina Pfeiffer, PA-C Referring: Katina Pfeiffer, PA-C Visit Date: 04/07/2024 Occupation: Data Unavailable  Subjective:  Fatigue   History of Present Illness: [name] is a 43 y.o. female with fibromyalgia, polyarthralgia and positive ANA.  She returns today after her last visit in March 2025.  She states she was diagnosed with sleep apnea and started using CPAP.  She has not noticed any improvement in her fatigue yet.  He denies any insomnia.  She continues to have some generalized pain and discomfort from fibromyalgia.  She continues to have fatigue, dry mouth, dry eyes.  There is no history of oral ulcers, nasal ulcers, malar rash, Raynaud's, lymphadenopathy or inflammatory arthritis.  She continues to have some discomfort in her knee joints.  She has been experiencing paresthesias on the dorsum of her left foot.  She states the symptoms comes and go.  She states turning her foot makes the paresthesias worse.    Activities of Daily Living:  Patient reports morning stiffness for 30 minutes.   Patient Reports nocturnal pain.  Difficulty dressing/grooming: Denies Difficulty climbing stairs: Reports Difficulty getting out of chair: Reports Difficulty using hands for taps, buttons, cutlery, and/or writing: Reports  Review of Systems  Constitutional:  Positive for fatigue.  HENT:  Positive for mouth dryness. Negative for mouth sores.   Eyes:  Positive for dryness.  Respiratory:  Negative for shortness of breath.   Cardiovascular:  Negative for chest pain and palpitations.  Gastrointestinal:  Negative for blood in stool, constipation and diarrhea.  Endocrine: Negative for increased urination.  Genitourinary:  Negative for involuntary urination.  Musculoskeletal:  Positive for joint pain, joint pain, myalgias, muscle weakness, morning stiffness, muscle  tenderness and myalgias. Negative for gait problem and joint swelling.  Skin:  Negative for color change, rash, hair loss and sensitivity to sunlight.  Allergic/Immunologic: Positive for susceptible to infections.  Neurological:  Negative for dizziness and headaches.  Hematological:  Negative for swollen glands.  Psychiatric/Behavioral:  Negative for depressed mood and sleep disturbance. The patient is not nervous/anxious.     PMFS History:  Patient Active Problem List   Diagnosis Date Noted   Protrusion of lumbar intervertebral disc 01/17/2022   Body mass index (BMI) 39.0-39.9, adult 05/23/2021   Fibromyalgia 05/03/2020   Chondromalacia of right patella 05/03/2020   Family history of systemic lupus erythematosus 09/16/2018   Hx of migraines 08/21/2018   Prediabetes 08/21/2018   Vitamin D  deficiency 08/21/2018   History of carpal tunnel syndrome 08/21/2018    Past Medical History:  Diagnosis Date   Fibromyalgia    Healthy adult    Migraine     Family History  Problem Relation Age of Onset   Hypertension Mother    Diabetes Mother    Asthma Mother    Stroke Mother    Asthma Father    Heart attack Father    Urolithiasis Father    Hypertension Brother    Heart attack Brother    Diverticulitis Brother    Urolithiasis Brother    Asthma Brother    Healthy Daughter    Healthy Son    Past Surgical History:  Procedure Laterality Date   ABLATION     CHOLECYSTECTOMY  TONSILLECTOMY     TUBAL LIGATION     Social History   Tobacco Use   Smoking status: Never    Passive exposure: Never   Smokeless tobacco: Never  Vaping Use   Vaping status: Never Used  Substance Use Topics   Alcohol use: Yes    Comment: occ   Drug use: Never   Social History   Social History Narrative   Lives with two children   Drinks caffeine every once in a while      Immunization History  Administered Date(s) Administered   Hepatitis B, ADULT 12/11/2017, 01/11/2018, 06/17/2018    Influenza Split 04/20/2013   PPD Test 12/11/2017   Tdap 10/01/2007, 09/22/2017     Objective: Vital Signs: BP 126/86   Pulse 91   Temp 97.6 F (36.4 C)   Resp 13   Ht 5' 2.5 (1.588 m)   Wt 234 lb 12.8 oz (106.5 kg)   BMI 42.26 kg/m    Physical Exam Vitals and nursing note reviewed.  Constitutional:      Appearance: She is well-developed.  HENT:     Head: Normocephalic and atraumatic.  Eyes:     Conjunctiva/sclera: Conjunctivae normal.  Cardiovascular:     Rate and Rhythm: Normal rate and regular rhythm.     Heart sounds: Normal heart sounds.  Pulmonary:     Effort: Pulmonary effort is normal.     Breath sounds: Normal breath sounds.  Abdominal:     General: Bowel sounds are normal.     Palpations: Abdomen is soft.  Musculoskeletal:     Cervical back: Normal range of motion.  Lymphadenopathy:     Cervical: No cervical adenopathy.  Skin:    General: Skin is warm and dry.     Capillary Refill: Capillary refill takes less than 2 seconds.  Neurological:     Mental Status: She is alert and oriented to person, place, and time.  Psychiatric:        Behavior: Behavior normal.      Musculoskeletal Exam: Cervical, thoracic and lumbar spine were in good range of motion.  There was no SI joint tenderness.  Shoulder joints, elbow joints, wrist joints, MCPs, PIPs and DIPs were in good range of motion with no synovitis.  Hip joints and knee joints were in good range of motion without any warmth swelling or effusion.  There was no tenderness over ankles or MTPs.   CDAI Exam: CDAI Score: -- Patient Global: --; Provider Global: -- Swollen: --; Tender: -- Joint Exam 04/07/2024   No joint exam has been documented for this visit   There is currently no information documented on the homunculus. Go to the Rheumatology activity and complete the homunculus joint exam.  Investigation: No additional findings.  Imaging: No results found.  Recent Labs: Lab Results  Component  Value Date   WBC 8.1 09/29/2023   HGB 12.6 09/29/2023   PLT 287 09/29/2023   NA 139 09/29/2023   NA 139 09/29/2023   K 4.4 09/29/2023   K 4.4 09/29/2023   CL 101 09/29/2023   CL 101 09/29/2023   CO2 29 09/29/2023   CO2 29 09/29/2023   GLUCOSE 133 (H) 09/29/2023   GLUCOSE 133 (H) 09/29/2023   BUN 7 09/29/2023   BUN 7 09/29/2023   CREATININE 0.66 09/29/2023   CREATININE 0.66 09/29/2023   BILITOT 0.2 09/29/2023   BILITOT 0.2 09/29/2023   ALKPHOS 55 09/25/2014   AST 12 09/29/2023   AST 12 09/29/2023  ALT 15 09/29/2023   ALT 15 09/29/2023   PROT 7.2 09/29/2023   PROT 7.2 09/29/2023   ALBUMIN 3.5 09/25/2014   CALCIUM 9.6 09/29/2023   CALCIUM 9.6 09/29/2023   GFRAA 131 01/07/2020   September 29, 2023 ANA 1: 80 NS, dsDNA negative, C3-C4 normal, sed rate 112, CRP 38.2, RF negative, anti-CCP negative, MCV negative  Speciality Comments: No specialty comments available.  Procedures:  No procedures performed Allergies: Pineapple, Tinidazole, Amoxicillin-pot clavulanate, Other, and Biaxin [clarithromycin]   Assessment / Plan:     Visit Diagnoses: Fibromyalgia-she continues to have generalized pain and discomfort.  No muscular weakness or tenderness was noted on examination.  She had few tender points.  Positive ANA (antinuclear antibody) - ANA 1:320 Nuclear, dense fine speckled, Anti-CCP 37: Repeat CCP and body was negative.  Smith negative and dsDNA negative on 01/07/20. -She has positive ANA.  ENA panel has been negative and complements have been normal.  Plan: CBC with Differential/Platelet, Comprehensive metabolic panel with GFR, Sedimentation rate, Protein / creatinine ratio, urine, ANA, Anti-DNA antibody, double-stranded, C3 and C4  Positive anti-CCP test - Anti-CCP negative on 09/13/2021.  Ultrasound of both hands from 07/05/2022 was negative.  Elevated sed rate-she has elevated sedimentation rate and CRP for many years.  Probably related to her high BMI.  Polyarthralgia-she  continues to have intermittent discomfort in her joints.  No synovitis was noted.  History of carpal tunnel syndrome - Bilateral-mild median nerve entrapment at wrist revealed on NCV with EMG on 05/16/21.  She denies paresthesias today.  Chronic pain of right knee - s/p right knee cortisone injection on 09/29/2023.  She states the right knee joint pain has improved.  Paresthesia of left foot-she complains of ongoing paresthesias on the dorsum of the left foot.  She states the symptoms get worse when she twists her ankle.  I will refer her to [facility] for evaluation.  Degeneration of intervertebral disc of lumbar region without discogenic back pain or lower extremity pain - Previously had an epidural steroid injection performed on 12/18/2021.  referred to [facility] at the last visit.  Other medical problems listed as follows:  Intermittent diarrhea  Prediabetes  Vitamin D  deficiency  Hx of migraines  OSA (obstructive sleep apnea)-recent diagnosis.  She has been using CPAP.  She denies any insomnia but has not noticed any improvement in her fatigue.  Class 3 severe obesity due to excess calories without serious comorbidity with body mass index (BMI) of 40.0 to 44.9 in adult -weight loss diet and exercise was emphasized.  Patient states she is on Ozempic.  She is increasing the dose gradually.  Family history of systemic lupus erythematosus  Orders: Orders Placed This Encounter  Procedures   CBC with Differential/Platelet   Comprehensive metabolic panel with GFR   Sedimentation rate   Protein / creatinine ratio, urine   ANA   Anti-DNA antibody, double-stranded   C3 and C4   No orders of the defined types were placed in this encounter.    Follow-Up Instructions: Return in about 6 months (around 10/05/2024) for Fibromyalgia, positive ANA.   Maya Nash, MD  Note - This record has been created using Animal nutritionist.  Chart creation errors have been  sought, but may not always  have been located. Such creation errors do not reflect on  the standard of medical care.

## 2024-04-07 ENCOUNTER — Encounter: Payer: Self-pay | Admitting: Rheumatology

## 2024-04-07 ENCOUNTER — Ambulatory Visit: Attending: Rheumatology | Admitting: Rheumatology

## 2024-04-07 VITALS — BP 126/86 | HR 91 | Temp 97.6°F | Resp 13 | Ht 62.5 in | Wt 234.8 lb

## 2024-04-07 DIAGNOSIS — E66813 Obesity, class 3: Secondary | ICD-10-CM

## 2024-04-07 DIAGNOSIS — M797 Fibromyalgia: Secondary | ICD-10-CM | POA: Diagnosis not present

## 2024-04-07 DIAGNOSIS — R7303 Prediabetes: Secondary | ICD-10-CM

## 2024-04-07 DIAGNOSIS — G8929 Other chronic pain: Secondary | ICD-10-CM

## 2024-04-07 DIAGNOSIS — Z6841 Body Mass Index (BMI) 40.0 and over, adult: Secondary | ICD-10-CM

## 2024-04-07 DIAGNOSIS — Z8269 Family history of other diseases of the musculoskeletal system and connective tissue: Secondary | ICD-10-CM

## 2024-04-07 DIAGNOSIS — M2550 Pain in unspecified joint: Secondary | ICD-10-CM | POA: Diagnosis not present

## 2024-04-07 DIAGNOSIS — R202 Paresthesia of skin: Secondary | ICD-10-CM

## 2024-04-07 DIAGNOSIS — R700 Elevated erythrocyte sedimentation rate: Secondary | ICD-10-CM

## 2024-04-07 DIAGNOSIS — Z8669 Personal history of other diseases of the nervous system and sense organs: Secondary | ICD-10-CM

## 2024-04-07 DIAGNOSIS — R768 Other specified abnormal immunological findings in serum: Secondary | ICD-10-CM

## 2024-04-07 DIAGNOSIS — R197 Diarrhea, unspecified: Secondary | ICD-10-CM

## 2024-04-07 DIAGNOSIS — G4733 Obstructive sleep apnea (adult) (pediatric): Secondary | ICD-10-CM

## 2024-04-07 DIAGNOSIS — E559 Vitamin D deficiency, unspecified: Secondary | ICD-10-CM

## 2024-04-07 DIAGNOSIS — M25561 Pain in right knee: Secondary | ICD-10-CM

## 2024-04-07 DIAGNOSIS — M51369 Other intervertebral disc degeneration, lumbar region without mention of lumbar back pain or lower extremity pain: Secondary | ICD-10-CM

## 2024-04-09 LAB — ANTI-DNA ANTIBODY, DOUBLE-STRANDED: ds DNA Ab: <1 [IU]/mL

## 2024-04-09 LAB — CBC WITH DIFFERENTIAL/PLATELET
Absolute Lymphocytes: 3494 {cells}/uL (ref 850–3900)
Absolute Monocytes: 460 {cells}/uL (ref 200–950)
Basophils Absolute: 62 {cells}/uL (ref 0–200)
Basophils Relative: 0.8 %
Eosinophils Absolute: 148 {cells}/uL (ref 15–500)
Eosinophils Relative: 1.9 %
HCT: 39.4 % (ref 35.0–45.0)
Hemoglobin: 12.3 g/dL (ref 11.7–15.5)
MCH: 25 pg — ABNORMAL LOW (ref 27.0–33.0)
MCHC: 31.2 g/dL — ABNORMAL LOW (ref 32.0–36.0)
MCV: 80.1 fL (ref 80.0–100.0)
MPV: 12.1 fL (ref 7.5–12.5)
Monocytes Relative: 5.9 %
Neutro Abs: 3635 {cells}/uL (ref 1500–7800)
Neutrophils Relative %: 46.6 %
Platelets: 283 Thousand/uL (ref 140–400)
RBC: 4.92 Million/uL (ref 3.80–5.10)
RDW: 14.9 % (ref 11.0–15.0)
Total Lymphocyte: 44.8 %
WBC: 7.8 Thousand/uL (ref 3.8–10.8)

## 2024-04-09 LAB — COMPREHENSIVE METABOLIC PANEL WITH GFR
AG Ratio: 1.3 (calc) (ref 1.0–2.5)
ALT: 10 U/L (ref 6–29)
AST: 12 U/L (ref 10–35)
Albumin: 3.9 g/dL (ref 3.6–5.1)
Alkaline phosphatase (APISO): 74 U/L (ref 31–125)
BUN: 7 mg/dL (ref 7–25)
CO2: 26 mmol/L (ref 20–32)
Calcium: 9.4 mg/dL (ref 8.6–10.2)
Chloride: 105 mmol/L (ref 98–110)
Creat: 0.63 mg/dL (ref 0.50–0.99)
Globulin: 3 g/dL (ref 1.9–3.7)
Glucose, Bld: 97 mg/dL (ref 65–99)
Potassium: 4.5 mmol/L (ref 3.5–5.3)
Sodium: 138 mmol/L (ref 135–146)
Total Bilirubin: 0.2 mg/dL (ref 0.2–1.2)
Total Protein: 6.9 g/dL (ref 6.1–8.1)
eGFR: 111 mL/min/1.73m2 (ref >=60)

## 2024-04-09 LAB — PROTEIN / CREATININE RATIO, URINE
Creatinine, Urine: 203 mg/dL (ref 20–275)
Protein/Creat Ratio: 74 mg/g{creat} (ref 24–184)
Protein/Creatinine Ratio: 0.074 mg/mg{creat} (ref 0.024–0.184)
Total Protein, Urine: 15 mg/dL (ref 5–24)

## 2024-04-09 LAB — C3 AND C4
C3 Complement: 187 mg/dL (ref 83–193)
C4 Complement: 37 mg/dL (ref 15–57)

## 2024-04-09 LAB — SEDIMENTATION RATE: Sed Rate: 84 mm/h — ABNORMAL HIGH (ref 0–20)

## 2024-04-09 LAB — ANTI-NUCLEAR AB-TITER (ANA TITER): ANA Titer 1: 1:320 {titer} — ABNORMAL HIGH

## 2024-04-09 LAB — ANA: Anti Nuclear Antibody (ANA): POSITIVE — AB

## 2024-04-12 ENCOUNTER — Ambulatory Visit: Payer: Self-pay | Admitting: Rheumatology

## 2024-04-12 NOTE — Progress Notes (Signed)
 CBC normal, sed rate remains elevated, ANA remains positive, double-stranded DNA negative, CMP normal, urine protein creatinine ratio normal, complements normal.  Labs do not indicate an autoimmune disease flare.

## 2024-04-21 ENCOUNTER — Ambulatory Visit: Admitting: Podiatry

## 2024-04-21 DIAGNOSIS — M21961 Unspecified acquired deformity of right lower leg: Secondary | ICD-10-CM | POA: Diagnosis not present

## 2024-04-21 DIAGNOSIS — M21962 Unspecified acquired deformity of left lower leg: Secondary | ICD-10-CM | POA: Diagnosis not present

## 2024-04-21 DIAGNOSIS — M7752 Other enthesopathy of left foot: Secondary | ICD-10-CM | POA: Diagnosis not present

## 2024-04-21 NOTE — Progress Notes (Signed)
 Subjective:  Patient ID: [name], female    DOB: 09/27/1980,  MRN: [id number]  Chief Complaint  Patient presents with   Foot Pain    Left foot burning sensation     43 y.o. female presents with the above complaint. Patient presents with complaint left second metatarsophalangeal joint pain.  Painful to touch is progressing and worse worse with ambulation worse with pressure she would like to discuss treatment options for this.  She states pain scale 7 out of 10 dull achy in nature.  Hurts on the forefoot.  She does not wear any orthotics.  Review of Systems: Negative except as noted in the HPI. Denies N/V/F/Ch.  Past Medical History:  Diagnosis Date   Fibromyalgia    Healthy adult    Migraine     Current Outpatient Medications:    albuterol (VENTOLIN HFA) 108 (90 Base) MCG/ACT inhaler, 1 puff as needed Inhalation every 6 hrs for shortness of breath, Disp: , Rfl:    aspirin-acetaminophen -caffeine (EXCEDRIN MIGRAINE) 250-250-65 MG per tablet, Take by mouth every 6 (six) hours as needed for headache., Disp: , Rfl:    budeson-glycopyrrolate-formoterol (BREZTRI AEROSPHERE) 160-9-4.8 MCG/ACT AERO, 2 puffs Inhalation Twice a day as needed for 7 days, Disp: , Rfl:    cetirizine (ZYRTEC) 10 MG tablet, Take 10 mg by mouth daily as needed for allergies., Disp: , Rfl:    Cholecalciferol (VITAMIN D3) 5000 UNITS TABS, Take 5,000 Units by mouth daily., Disp: , Rfl:    diclofenac  Sodium (VOLTAREN ) 1 % GEL, Apply 2-4 grams to affected joint 4 times daily as needed., Disp: 400 g, Rfl: 2   dicyclomine (BENTYL) 10 MG capsule, 1 capsule 30 minutes before eating Orally Three times a day prn for 30 days, Disp: , Rfl:    diphenoxylate -atropine  (LOMOTIL ) 2.5-0.025 MG tablet, Take 1-2 tablets by mouth 4 (four) times daily as needed for diarrhea or loose stools., Disp: 10 tablet, Rfl: 0   DULoxetine  (CYMBALTA ) 60 MG capsule, TAKE 1 CAPSULE BY MOUTH EVERY DAY, Disp: 90 capsule, Rfl: 0   Ferrous Sulfate  (IRON PO), Take by mouth daily., Disp: , Rfl:    ipratropium (ATROVENT) 0.03 % nasal spray, 2 sprays in each nostril Nasally Twice a day for 30 days, Disp: , Rfl:    methocarbamol  (ROBAXIN ) 500 MG tablet, Take 1 tablet (500 mg total) by mouth 2 (two) times daily as needed for muscle spasms., Disp: 60 tablet, Rfl: 0   OZEMPIC, 0.25 OR 0.5 MG/DOSE, 2 MG/3ML SOPN, Inject 0.5 mg into the skin once a week., Disp: , Rfl:    Polyvinyl Alcohol-Povidone (REFRESH OP), Apply to eye as needed. , Disp: , Rfl:    valACYclovir (VALTREX) 500 MG tablet, Take 500 mg by mouth daily., Disp: , Rfl:   Social History   Tobacco Use  Smoking Status Never   Passive exposure: Never  Smokeless Tobacco Never    Allergies  Allergen Reactions   Pineapple Anaphylaxis   Tinidazole Rash   Amoxicillin-Pot Clavulanate Diarrhea   Other     OLIVES   Biaxin [Clarithromycin] Rash   Objective:  There were no vitals filed for this visit. There is no height or weight on file to calculate BMI. Constitutional Well developed. Well nourished.  Vascular Dorsalis pedis pulses palpable bilaterally. Posterior tibial pulses palpable bilaterally. Capillary refill normal to all digits.  No cyanosis or clubbing noted. Pedal hair growth normal.  Neurologic Normal speech. Oriented to person, place, and time. Epicritic sensation to light touch grossly  present bilaterally.  Dermatologic Nails well groomed and normal in appearance. No open wounds. No skin lesions.  Orthopedic: Pain on palpation left second metatarsophalangeal joint pain with range of motion of the joint no deep intra-articular pain noted.  No extensor flexor tendinitis noted.  Pes planovalgus foot structure noted   Radiographs: None Assessment:   1. Capsulitis of metatarsophalangeal (MTP) joint of left foot   2. Foot deformity, bilateral    Plan:  Patient was evaluated and treated and all questions answered.  Left second metatarsophalangeal joint  capsulitis - All questions and concerns were discussed with the patient extensive due to given the amount of pain that she is and she would benefit from steroid injection because confirmed recommended surgical pain.  Patient agrees with plan to proceed with steroid injection -A steroid injection was performed at left second MTP using 1% plain Lidocaine  and 10 mg of Kenalog . This was well tolerated.  Pes planovalgus/foot deformity -I explained to patient the etiology of pes planovalgus and relationship with heel pain/arch pain and various treatment options were discussed.  Given patient foot structure in the setting of heel pain/arch pain I believe patient will benefit from custom-made orthotics to help control the hindfoot motion support the arch of the foot and take the stress away from arches.  Patient agrees with the plan like to proceed with orthotics -Patient was casted for orthotics    No follow-ups on file.

## 2024-05-19 ENCOUNTER — Ambulatory Visit: Admitting: Podiatry

## 2024-05-19 DIAGNOSIS — M7752 Other enthesopathy of left foot: Secondary | ICD-10-CM | POA: Diagnosis not present

## 2024-05-19 NOTE — Progress Notes (Signed)
 Subjective:  Patient ID: [name], female    DOB: 1981-05-12,  MRN: [id number]  Chief Complaint  Patient presents with   Capsulitis of metatarsophalangeal (MTP) joint of left foot    Pt is here for a follow up on Capsulitis of metatarsophalangeal (MTP) joint of left foot    43 y.o. female presents with the above complaint.  Patient presents for follow-up of left second metatarsophalangeal joint pain.  She states she is doing a lot better the injection helped considerably denies any other acute complaints.  Review of Systems: Negative except as noted in the HPI. Denies N/V/F/Ch.  Past Medical History:  Diagnosis Date   Fibromyalgia    Healthy adult    Migraine     Current Outpatient Medications:    albuterol (VENTOLIN HFA) 108 (90 Base) MCG/ACT inhaler, 1 puff as needed Inhalation every 6 hrs for shortness of breath, Disp: , Rfl:    aspirin-acetaminophen -caffeine (EXCEDRIN MIGRAINE) 250-250-65 MG per tablet, Take by mouth every 6 (six) hours as needed for headache., Disp: , Rfl:    budeson-glycopyrrolate-formoterol (BREZTRI AEROSPHERE) 160-9-4.8 MCG/ACT AERO, 2 puffs Inhalation Twice a day as needed for 7 days, Disp: , Rfl:    cetirizine (ZYRTEC) 10 MG tablet, Take 10 mg by mouth daily as needed for allergies., Disp: , Rfl:    Cholecalciferol (VITAMIN D3) 5000 UNITS TABS, Take 5,000 Units by mouth daily., Disp: , Rfl:    diclofenac  Sodium (VOLTAREN ) 1 % GEL, Apply 2-4 grams to affected joint 4 times daily as needed., Disp: 400 g, Rfl: 2   dicyclomine (BENTYL) 10 MG capsule, 1 capsule 30 minutes before eating Orally Three times a day prn for 30 days, Disp: , Rfl:    diphenoxylate -atropine  (LOMOTIL ) 2.5-0.025 MG tablet, Take 1-2 tablets by mouth 4 (four) times daily as needed for diarrhea or loose stools., Disp: 10 tablet, Rfl: 0   DULoxetine  (CYMBALTA ) 60 MG capsule, TAKE 1 CAPSULE BY MOUTH EVERY DAY, Disp: 90 capsule, Rfl: 0   Ferrous Sulfate (IRON PO), Take by mouth daily., Disp:  , Rfl:    ipratropium (ATROVENT) 0.03 % nasal spray, 2 sprays in each nostril Nasally Twice a day for 30 days, Disp: , Rfl:    methocarbamol  (ROBAXIN ) 500 MG tablet, Take 1 tablet (500 mg total) by mouth 2 (two) times daily as needed for muscle spasms., Disp: 60 tablet, Rfl: 0   OZEMPIC, 0.25 OR 0.5 MG/DOSE, 2 MG/3ML SOPN, Inject 0.5 mg into the skin once a week., Disp: , Rfl:    Polyvinyl Alcohol-Povidone (REFRESH OP), Apply to eye as needed. , Disp: , Rfl:    valACYclovir (VALTREX) 500 MG tablet, Take 500 mg by mouth daily., Disp: , Rfl:   Social History   Tobacco Use  Smoking Status Never   Passive exposure: Never  Smokeless Tobacco Never    Allergies  Allergen Reactions   Pineapple Anaphylaxis   Tinidazole Rash   Amoxicillin-Pot Clavulanate Diarrhea   Other     OLIVES   Biaxin [Clarithromycin] Rash   Objective:  There were no vitals filed for this visit. There is no height or weight on file to calculate BMI. Constitutional Well developed. Well nourished.  Vascular Dorsalis pedis pulses palpable bilaterally. Posterior tibial pulses palpable bilaterally. Capillary refill normal to all digits.  No cyanosis or clubbing noted. Pedal hair growth normal.  Neurologic Normal speech. Oriented to person, place, and time. Epicritic sensation to light touch grossly present bilaterally.  Dermatologic Nails well groomed and normal in appearance.  No open wounds. No skin lesions.  Orthopedic: No further pain on palpation left second metatarsophalangeal joint pain with range of motion of the joint no deep intra-articular pain noted.  No extensor flexor tendinitis noted.  Pes planovalgus foot structure noted   Radiographs: None Assessment:   1. Capsulitis of metatarsophalangeal (MTP) joint of left foot     Plan:  Patient was evaluated and treated and all questions answered.  Left second metatarsophalangeal joint capsulitis Clinically healed officially discharged from our care if  any foot and ankle issues on future she will come back and see me.  Patient is in agreement with the plan.  She denies any other acute complaints.  Pes planovalgus/foot deformity -I explained to patient the etiology of pes planovalgus and relationship with heel pain/arch pain and various treatment options were discussed.  Given patient foot structure in the setting of heel pain/arch pain I believe patient will benefit from custom-made orthotics to help control the hindfoot motion support the arch of the foot and take the stress away from arches.  Patient agrees with the plan like to proceed with orthotics -Patient was casted for orthotics    No follow-ups on file.

## 2024-07-01 ENCOUNTER — Other Ambulatory Visit: Payer: Self-pay | Admitting: Rheumatology

## 2024-07-01 NOTE — Telephone Encounter (Signed)
 Last Fill: 12/11/2023  Next Visit: 10/05/2024  Last Visit: 04/07/2024  Dx:  Fibromyalgia   Current Dose per office note on 04/07/2024: not discussed  Okay to refill Cymbalta ?

## 2024-10-05 ENCOUNTER — Ambulatory Visit: Admitting: Rheumatology
